# Patient Record
Sex: Male | Born: 1960
Health system: Southern US, Community
[De-identification: ages and names within clinical notes are randomized; demographics above are authoritative.]

## PROBLEM LIST (undated history)

## (undated) DIAGNOSIS — I10 Essential (primary) hypertension: Secondary | ICD-10-CM

## (undated) DIAGNOSIS — E785 Hyperlipidemia, unspecified: Secondary | ICD-10-CM

## (undated) HISTORY — DX: Essential (primary) hypertension: I10

## (undated) HISTORY — DX: Hyperlipidemia, unspecified: E78.5

---

## 2016-09-19 LAB — HM COLONOSCOPY

## 2021-03-22 ENCOUNTER — Other Ambulatory Visit: Payer: Self-pay

## 2021-03-22 ENCOUNTER — Ambulatory Visit: Payer: BC Managed Care – PPO | Admitting: Internal Medicine

## 2021-03-22 ENCOUNTER — Encounter: Payer: Self-pay | Admitting: Internal Medicine

## 2021-03-22 VITALS — BP 146/84 | HR 81 | Temp 98.0°F | Resp 18 | Ht 64.0 in | Wt 200.0 lb

## 2021-03-22 DIAGNOSIS — I10 Essential (primary) hypertension: Secondary | ICD-10-CM

## 2021-03-22 DIAGNOSIS — F17210 Nicotine dependence, cigarettes, uncomplicated: Secondary | ICD-10-CM | POA: Diagnosis not present

## 2021-03-22 DIAGNOSIS — Z114 Encounter for screening for human immunodeficiency virus [HIV]: Secondary | ICD-10-CM

## 2021-03-22 DIAGNOSIS — E782 Mixed hyperlipidemia: Secondary | ICD-10-CM | POA: Diagnosis not present

## 2021-03-22 DIAGNOSIS — Z720 Tobacco use: Secondary | ICD-10-CM | POA: Diagnosis not present

## 2021-03-22 DIAGNOSIS — Z1159 Encounter for screening for other viral diseases: Secondary | ICD-10-CM

## 2021-03-22 DIAGNOSIS — Z7689 Persons encountering health services in other specified circumstances: Secondary | ICD-10-CM | POA: Diagnosis not present

## 2021-03-22 MED ORDER — LISINOPRIL-HYDROCHLOROTHIAZIDE 20-25 MG PO TABS: 1.0000 | ORAL_TABLET | Freq: Every day | ORAL | 1 refills | Status: DC

## 2021-03-22 MED ORDER — SIMVASTATIN 20 MG PO TABS: 20.0000 mg | ORAL_TABLET | Freq: Every day | ORAL | 2 refills | Status: DC

## 2021-03-22 NOTE — Progress Notes (Signed)
New Patient Office Visit  Subjective:  Patient ID: Alexander Henson, male    DOB: [birth year]  Age: 60 y.o. MRN: [id number]  CC:  Chief Complaint  Patient presents with   New Patient (Initial Visit)    New patient was being seen [facility] [place] just establishing care     HPI Alexander Henson is a 60 y.o. male with PMH of HTN and HLD who presents for establishing care. He recently moved from [place].  HTN: His blood pressure was elevated in the office today.  He has run out of his medications since for last 1 month.  He mentions having mild, generalized headache for last few days.  He denies any dizziness, chest pain, dyspnea or palpitations.  HLD: He used to take simvastatin for it.  Denies any history of CAD or CVA.  He recently started smoking, but states that he smokes less than a pack per day.  He has had 1 dose of J&J COVID-vaccine.  Past Medical History:  Diagnosis Date   Hyperlipidemia    Hypertension     History reviewed. No pertinent surgical history.  History reviewed. No pertinent family history.  Social History   Socioeconomic History   Marital status: Single    Spouse name: Not on file   Number of children: Not on file   Years of education: Not on file   Highest education level: Not on file  Occupational History   Not on file  Tobacco Use   Smoking status: Some Days    Types: Cigarettes   Smokeless tobacco: Never  Substance and Sexual Activity   Alcohol use: Not Currently   Drug use: Never   Sexual activity: Not on file  Other Topics Concern   Not on file  Social History Narrative   Not on file   Social Determinants of Health   Financial Resource Strain: Not on file  Food Insecurity: Not on file  Transportation Needs: Not on file  Physical Activity: Not on file  Stress: Not on file  Social Connections: Not on file  Intimate Partner Violence: Not on file    ROS Review of Systems  Constitutional:  Negative for chills  and fever.  HENT:  Negative for congestion and sore throat.   Eyes:  Negative for pain and discharge.  Respiratory:  Negative for cough and shortness of breath.   Cardiovascular:  Negative for chest pain and palpitations.  Gastrointestinal:  Negative for constipation, diarrhea, nausea and vomiting.  Endocrine: Negative for polydipsia and polyuria.  Genitourinary:  Negative for dysuria and hematuria.  Musculoskeletal:  Negative for neck pain and neck stiffness.  Skin:  Negative for rash.  Neurological:  Positive for headaches. Negative for dizziness, weakness and numbness.  Psychiatric/Behavioral:  Negative for agitation and behavioral problems.    Objective:   Today's Vitals: BP (!) 146/84 (BP Location: Left Arm, Cuff Size: Normal)   Pulse 81   Temp 98 F (36.7 C) (Oral)   Resp 18   Ht '5\' 4"'  (1.626 m)   Wt 200 lb 0.6 oz (90.7 kg)   SpO2 92%   BMI 34.34 kg/m   Physical Exam Vitals reviewed.  Constitutional:      General: He is not in acute distress.    Appearance: He is not diaphoretic.  HENT:     Head: Normocephalic and atraumatic.     Nose: Nose normal.     Mouth/Throat:     Mouth: Mucous membranes are moist.  Eyes:     General: No scleral icterus.    Extraocular Movements: Extraocular movements intact.  Cardiovascular:     Rate and Rhythm: Normal rate and regular rhythm.     Pulses: Normal pulses.     Heart sounds: Normal heart sounds. No murmur heard. Pulmonary:     Breath sounds: Normal breath sounds. No wheezing or rales.  Abdominal:     Palpations: Abdomen is soft.     Tenderness: There is no abdominal tenderness.  Musculoskeletal:     Cervical back: Neck supple. No tenderness.     Right lower leg: No edema.     Left lower leg: No edema.  Skin:    General: Skin is warm.     Findings: No rash.  Neurological:     General: No focal deficit present.     Mental Status: He is alert and oriented to person, place, and time.  Psychiatric:        Mood and  Affect: Mood normal.        Behavior: Behavior normal.    Assessment & Plan:   Problem List Items Addressed This Visit       Encounter to establish care - Primary   Care established History and medications reviewed with the patient       Cardiovascular and Mediastinum   Essential hypertension    BP Readings from Last 1 Encounters:  03/22/21 (!) 146/84  Uncontrolled as he had run out of his medications Restarted Lisinopril-HCTZ 20-25 mg QD Counseled for compliance with the medications Advised DASH diet and moderate exercise/walking, at least 150 mins/week       Relevant Medications   lisinopril-hydrochlorothiazide (ZESTORETIC) 20-25 MG tablet   simvastatin (ZOCOR) 20 MG tablet   Other Relevant Orders   CBC with Differential/Platelet   CMP14+EGFR     Other         Relevant Orders   CBC with Differential/Platelet   Mixed hyperlipidemia    Restarted Simvastatin Check lipid profile      Relevant Medications   lisinopril-hydrochlorothiazide (ZESTORETIC) 20-25 MG tablet   simvastatin (ZOCOR) 20 MG tablet   Other Relevant Orders   Lipid Profile   Tobacco abuse    Smokes less than 1 pack/day, recently started  Asked about quitting: confirms that he currently smokes cigarettes Advise to quit smoking: Educated about QUITTING to reduce the risk of cancer, cardio and cerebrovascular disease. Assess willingness: Unwilling to quit at this time, but is working on cutting back. Assist with counseling and pharmacotherapy: Counseled for 5 minutes and literature provided. Arrange for follow up: Follow up in 3 months and continue to offer help.      Other Visit Diagnoses     Need for hepatitis C screening test       Relevant Orders   Hepatitis C Antibody   Encounter for screening for HIV       Relevant Orders   HIV antibody (with reflex)       Outpatient Encounter Medications as of 03/22/2021  Medication Sig   [DISCONTINUED] lisinopril-hydrochlorothiazide  (ZESTORETIC) 20-25 MG tablet Take 1 tablet by mouth daily.   [DISCONTINUED] simvastatin (ZOCOR) 20 MG tablet Take 20 mg by mouth at bedtime.   lisinopril-hydrochlorothiazide (ZESTORETIC) 20-25 MG tablet Take 1 tablet by mouth daily.   simvastatin (ZOCOR) 20 MG tablet Take 1 tablet (20 mg total) by mouth at bedtime.   No facility-administered encounter medications on file as of 03/22/2021.    Follow-up: Return in about 2  months (around 05/22/2021).   Lindell Spar, MD

## 2021-03-22 NOTE — Assessment & Plan Note (Signed)
BP Readings from Last 1 Encounters:  03/22/21 (!) 146/84   Uncontrolled as he had run out of his medications Restarted Lisinopril-HCTZ 20-25 mg QD Counseled for compliance with the medications Advised DASH diet and moderate exercise/walking, at least 150 mins/week

## 2021-03-22 NOTE — Assessment & Plan Note (Signed)
Restarted Simvastatin Check lipid profile

## 2021-03-22 NOTE — Patient Instructions (Addendum)
Please start taking medications as prescribed.  Please follow DASH diet and perform moderate exercise/walking at least 150 mins/week.  Thank you for choosing [facility]! We consider it our privilege to take care of you!

## 2021-03-22 NOTE — Assessment & Plan Note (Signed)
Care established History and medications reviewed with the patient 

## 2021-03-22 NOTE — Assessment & Plan Note (Addendum)
Smokes less than 1 pack/day, recently started ? ?Asked about quitting: confirms that he currently smokes cigarettes ?Advise to quit smoking: Educated about QUITTING to reduce the risk of cancer, cardio and cerebrovascular disease. ?Assess willingness: Unwilling to quit at this time, but is working on cutting back. ?Assist with counseling and pharmacotherapy: Counseled for 5 minutes and literature provided. ?Arrange for follow up: Follow up in 3 months and continue to offer help. ?

## 2021-04-22 ENCOUNTER — Other Ambulatory Visit: Payer: Self-pay | Admitting: Internal Medicine

## 2021-04-22 DIAGNOSIS — I10 Essential (primary) hypertension: Secondary | ICD-10-CM

## 2021-05-24 ENCOUNTER — Ambulatory Visit: Payer: BC Managed Care – PPO | Admitting: Internal Medicine

## 2021-05-24 ENCOUNTER — Other Ambulatory Visit: Payer: Self-pay

## 2021-05-24 ENCOUNTER — Encounter: Payer: Self-pay | Admitting: Internal Medicine

## 2021-05-24 VITALS — BP 124/78 | HR 70 | Resp 18 | Ht 64.0 in | Wt 199.1 lb

## 2021-05-24 DIAGNOSIS — I10 Essential (primary) hypertension: Secondary | ICD-10-CM

## 2021-05-24 DIAGNOSIS — E782 Mixed hyperlipidemia: Secondary | ICD-10-CM | POA: Diagnosis not present

## 2021-05-24 DIAGNOSIS — Z2821 Immunization not carried out because of patient refusal: Secondary | ICD-10-CM | POA: Diagnosis not present

## 2021-05-24 NOTE — Assessment & Plan Note (Signed)
BP Readings from Last 1 Encounters:  05/24/21 124/78   Well-controlled with Lisinopril-HCTZ 20-25 mg QD Counseled for compliance with the medications Advised DASH diet and moderate exercise/walking, at least 150 mins/week

## 2021-05-24 NOTE — Progress Notes (Signed)
Established Patient Office Visit  Subjective:  Patient ID: Alexander Henson, male    DOB: [birth year]  Age: 60 y.o. MRN: [id number]  CC:  Chief Complaint  Patient presents with   Follow-up    2 month follow up    HPI Alexander Henson is a 60 y.o. male with past medical history of HTN and HLD who presents for follow up of HTN.  HTN: BP is well-controlled. Takes medications regularly. Patient denies headache, dizziness, chest pain, dyspnea or palpitations.   Past Medical History:  Diagnosis Date   Hyperlipidemia    Hypertension     History reviewed. No pertinent surgical history.  History reviewed. No pertinent family history.  Social History   Socioeconomic History   Marital status: Single    Spouse name: Not on file   Number of children: Not on file   Years of education: Not on file   Highest education level: Not on file  Occupational History   Not on file  Tobacco Use   Smoking status: Some Days    Types: Cigarettes   Smokeless tobacco: Never  Substance and Sexual Activity   Alcohol use: Not Currently   Drug use: Never   Sexual activity: Not on file  Other Topics Concern   Not on file  Social History Narrative   Not on file   Social Determinants of Health   Financial Resource Strain: Not on file  Food Insecurity: Not on file  Transportation Needs: Not on file  Physical Activity: Not on file  Stress: Not on file  Social Connections: Not on file  Intimate Partner Violence: Not on file    Outpatient Medications Prior to Visit  Medication Sig Dispense Refill   lisinopril-hydrochlorothiazide (ZESTORETIC) 20-25 MG tablet TAKE 1 TABLET BY MOUTH DAILY 30 tablet 5   simvastatin (ZOCOR) 20 MG tablet Take 1 tablet (20 mg total) by mouth at bedtime. 30 tablet 2   No facility-administered medications prior to visit.    Not on File  ROS Review of Systems  Constitutional:  Negative for chills and fever.  HENT:  Negative for congestion and sore throat.   Eyes:   Negative for pain and discharge.  Respiratory:  Negative for cough and shortness of breath.   Cardiovascular:  Negative for chest pain and palpitations.  Gastrointestinal:  Negative for constipation, diarrhea, nausea and vomiting.  Endocrine: Negative for polydipsia and polyuria.  Genitourinary:  Negative for dysuria and hematuria.  Musculoskeletal:  Negative for neck pain and neck stiffness.  Skin:  Negative for rash.  Neurological:  Negative for dizziness, weakness and numbness.  Psychiatric/Behavioral:  Negative for agitation and behavioral problems.      Objective:    Physical Exam Vitals reviewed.  Constitutional:      General: He is not in acute distress.    Appearance: He is not diaphoretic.  HENT:     Head: Normocephalic and atraumatic.     Nose: Nose normal.     Mouth/Throat:     Mouth: Mucous membranes are moist.  Eyes:     General: No scleral icterus.    Extraocular Movements: Extraocular movements intact.  Cardiovascular:     Rate and Rhythm: Normal rate and regular rhythm.     Pulses: Normal pulses.     Heart sounds: Normal heart sounds. No murmur heard. Pulmonary:     Breath sounds: Normal breath sounds. No wheezing or rales.  Abdominal:     Palpations: Abdomen is soft.     Tenderness:  There is no abdominal tenderness.  Musculoskeletal:     Cervical back: Neck supple. No tenderness.     Right lower leg: No edema.     Left lower leg: No edema.  Skin:    General: Skin is warm.     Findings: No rash.  Neurological:     General: No focal deficit present.     Mental Status: He is alert and oriented to person, place, and time.  Psychiatric:        Mood and Affect: Mood normal.        Behavior: Behavior normal.    BP 124/78 (BP Location: Left Arm, Patient Position: Sitting, Cuff Size: Normal)   Pulse 70   Resp 18   Ht _0  (1.626 m)   Wt 199 lb 1.3 oz (90.3 kg)   SpO2 96%   BMI 34.17 kg/m  Wt Readings from Last 3 Encounters:  05/24/21 199 lb 1.3 oz  (90.3 kg)  03/22/21 200 lb 0.6 oz (90.7 kg)    No results found for: TSH No results found for: WBC, HGB, HCT, MCV, PLT No results found for: NA, K, CHLORIDE, CO2, GLUCOSE, BUN, CREATININE, BILITOT, ALKPHOS, AST, ALT, PROT, ALBUMIN, CALCIUM, ANIONGAP, EGFR, GFR No results found for: CHOL No results found for: HDL No results found for: LDLCALC No results found for: TRIG No results found for: CHOLHDL No results found for: HGBA1C    Assessment & Plan:   Problem List Items Addressed This Visit       Cardiovascular and Mediastinum   Essential hypertension - Primary    BP Readings from Last 1 Encounters:  05/24/21 124/78  Well-controlled with Lisinopril-HCTZ 20-25 mg QD Counseled for compliance with the medications Advised DASH diet and moderate exercise/walking, at least 150 mins/week         Other   Mixed hyperlipidemia    Restarted Simvastatin Check lipid profile      Other Visit Diagnoses     Refused influenza vaccine           No orders of the defined types were placed in this encounter.   Follow-up: Return in about 6 months (around 11/21/2021) for Annual physical.    Alexander Spar, MD

## 2021-05-24 NOTE — Patient Instructions (Signed)
Please continue taking medications as prescribed.  Please continue to follow DASH diet and perform moderate exercise/walking at least 150 mins/week.  Please get fasting blood tests done within a week. 

## 2021-05-24 NOTE — Assessment & Plan Note (Signed)
Restarted Simvastatin Check lipid profile

## 2021-05-29 ENCOUNTER — Encounter: Payer: Self-pay | Admitting: Nurse Practitioner

## 2021-05-29 ENCOUNTER — Other Ambulatory Visit: Payer: Self-pay

## 2021-05-29 ENCOUNTER — Ambulatory Visit: Payer: BC Managed Care – PPO | Admitting: Nurse Practitioner

## 2021-05-29 VITALS — BP 138/78 | HR 78 | Temp 97.9°F | Ht 64.0 in | Wt 202.0 lb

## 2021-05-29 DIAGNOSIS — I10 Essential (primary) hypertension: Secondary | ICD-10-CM | POA: Diagnosis not present

## 2021-05-29 DIAGNOSIS — R109 Unspecified abdominal pain: Secondary | ICD-10-CM

## 2021-05-29 LAB — POCT URINALYSIS DIP (CLINITEK)
Bilirubin, UA: NEGATIVE
Blood, UA: NEGATIVE
Glucose, UA: NEGATIVE mg/dL
Ketones, POC UA: NEGATIVE mg/dL
Leukocytes, UA: NEGATIVE
Nitrite, UA: NEGATIVE
POC PROTEIN,UA: NEGATIVE
Spec Grav, UA: 1.015 (ref 1.010–1.025)
Urobilinogen, UA: 0.2 E.U./dL
pH, UA: 5.5 (ref 5.0–8.0)

## 2021-05-29 MED ORDER — CYCLOBENZAPRINE HCL 5 MG PO TABS: 5.0000 mg | ORAL_TABLET | Freq: Three times a day (TID) | ORAL | 1 refills | Status: DC | PRN

## 2021-05-29 MED ORDER — IBUPROFEN 600 MG PO TABS: 600.0000 mg | ORAL_TABLET | Freq: Three times a day (TID) | ORAL | 0 refills | Status: DC | PRN

## 2021-05-29 NOTE — Progress Notes (Deleted)
   Alexander Henson     MRN: [id number]      DOB: [birth year]   HPI Alexander Henson is here for acute visit.  PT c/o right flank pain that started suddenly  pain is getting worse, aching pain 7/10. Moving and walking make it worse.  Pin feels like the type of pain he had 8 years ago when he problems with kidney. PT is a Corporate investment banker. No fever , chills , numbness , tingling , no bloody urine, no constipation, no diarrhea, no blood in the stool.  PT took some ibuprofen this morning and it did not help.      ROS Denies recent fever or chills. Denies sinus pressure, nasal congestion, ear pain or sore throat. Denies chest congestion, productive cough or wheezing. Denies chest pains, palpitations and leg swelling Denies abdominal pain, nausea, vomiting,diarrhea or constipation.   Denies dysuria, frequency, hesitancy or incontinence. Denies joint pain, swelling and limitation in mobility. Denies headaches, seizures, numbness, or tingling. Denies depression, anxiety or insomnia. Denies skin break down or rash.   PE  BP (!) 154/79 (BP Location: Left Arm, Patient Position: Sitting, Cuff Size: Normal)   Pulse 78   Temp 97.9 F (36.6 C) (Oral)   Ht 5\' 4"  (1.626 m)   Wt 202 lb (91.6 kg)   SpO2 98%   BMI 34.67 kg/m   Patient alert and oriented and in no cardiopulmonary distress.  HEENT: No facial asymmetry, EOMI,     Neck supple .  Chest: Clear to auscultation bilaterally.  CVS: S1, S2 no murmurs, no S3.Regular rate.  ABD: Soft non tender.   Ext: No edema  MS: Adequate ROM spine, shoulders, hips and knees.  Skin: Intact, no ulcerations or rash noted.  Psych: Good eye contact, normal affect. Memory intact not anxious or depressed appearing.  CNS: CN 2-12 intact, power,  normal throughout.no focal deficits noted.   Assessment & Plan  ***

## 2021-05-29 NOTE — Patient Instructions (Signed)
Please get your labs done tomorrow as we discussed. Take ibuprofen 600mg  three times daily as needed for your left side pain.  Take flexeril 5mg  three times daily as needed for muscle spasm.  Please call the office by Wednesday if your pain does not get better  if it gets worse.    It is important that you exercise regularly at least 30 minutes 5 times a week.  Think about what you will eat, plan ahead. Choose " clean, green, fresh or frozen" over canned, processed or packaged foods which are more sugary, salty and fatty. 70 to 75% of food eaten should be vegetables and fruit. Three meals at set times with snacks allowed between meals, but they must be fruit or vegetables. Aim to eat over a 12 hour period , example 7 am to 7 pm, and STOP after  your last meal of the day. Drink water,generally about 64 ounces per day, no other drink is as healthy. Fruit juice is best enjoyed in a healthy way, by EATING the fruit.  Thanks for choosing [facility], we consider it a privelige to serve you.

## 2021-05-29 NOTE — Assessment & Plan Note (Addendum)
Well controlled, continue current med.  Importance of low salt diet and regular vigorous exercise 30 minutes 5 times a week discussed.  PT advised to get labs done as ordered.

## 2021-05-29 NOTE — Progress Notes (Signed)
Acute Office Visit  Subjective:    Patient ID: Alexander Henson, male    DOB: December 18, 1960, 60 y.o.   MRN: [id number]  Chief Complaint  Patient presents with   Back Pain    Started having right lower back pain this morning. Has used heating pad which did help some. Hurts more when up and walking. No dysuria or trouble with bowels.     Back Pain Pertinent negatives include no abdominal pain, chest pain, dysuria or fever.  Patient is in today for r. [name] is here for acute visit.  PT c/o right flank pain that started suddenly  pain is getting worse, aching pain 7/10. Moving and walking make it worse.  Pain feels like the type of pain he had 8 years ago when he problems with kidney. PT is a Nature conservation officer. PT denies fever ,chills CP palpitation wheezing,, SOB, numbness , tingling , bloody urine, dysuria, cloudy, constipation,  diarrhea, blood in the stool.  PT took some ibuprofen this morning and it did not help.   Past Medical History:  Diagnosis Date   Hyperlipidemia    Hypertension     History reviewed. No pertinent surgical history.  History reviewed. No pertinent family history.  Social History   Socioeconomic History   Marital status: Single    Spouse name: Not on file   Number of children: Not on file   Years of education: Not on file   Highest education level: Not on file  Occupational History   Not on file  Tobacco Use   Smoking status: Some Days    Types: Cigarettes   Smokeless tobacco: Never  Substance and Sexual Activity   Alcohol use: Not Currently   Drug use: Never   Sexual activity: Not on file  Other Topics Concern   Not on file  Social History Narrative   Not on file   Social Determinants of Health   Financial Resource Strain: Not on file  Food Insecurity: Not on file  Transportation Needs: Not on file  Physical Activity: Not on file  Stress: Not on file  Social Connections: Not on file  Intimate Partner Violence: Not on file     Outpatient Medications Prior to Visit  Medication Sig Dispense Refill   lisinopril-hydrochlorothiazide (ZESTORETIC) 20-25 MG tablet TAKE 1 TABLET BY MOUTH DAILY 30 tablet 5   simvastatin (ZOCOR) 20 MG tablet Take 1 tablet (20 mg total) by mouth at bedtime. 30 tablet 2   No facility-administered medications prior to visit.    No Known Allergies  Review of Systems  Constitutional:  Negative for activity change, chills, fatigue and fever.  Respiratory:  Negative for cough, chest tightness, shortness of breath and wheezing.   Cardiovascular:  Negative for chest pain, palpitations and leg swelling.  Gastrointestinal:  Negative for abdominal pain, anal bleeding, blood in stool, constipation, diarrhea, nausea and vomiting.       Right flank pain 7/10  Genitourinary:  Positive for flank pain. Negative for decreased urine volume, dysuria, frequency, hematuria, penile discharge and urgency.  Psychiatric/Behavioral:  Negative for agitation, behavioral problems and confusion. The patient is not nervous/anxious.       Objective:    Physical Exam Constitutional:      General: He is not in acute distress.    Appearance: He is obese. He is not ill-appearing, toxic-appearing or diaphoretic.  Cardiovascular:     Rate and Rhythm: Normal rate and regular rhythm.     Pulses: Normal pulses.  Heart sounds: Normal heart sounds. No murmur heard.   No friction rub. No gallop.  Pulmonary:     Effort: Pulmonary effort is normal. No respiratory distress.     Breath sounds: Normal breath sounds. No stridor. No wheezing, rhonchi or rales.  Chest:     Chest wall: No tenderness.  Abdominal:     Palpations: Abdomen is soft. There is no mass.     Tenderness: There is right CVA tenderness. There is no guarding or rebound.     Hernia: No hernia is present.  Neurological:     Mental Status: He is alert.  Psychiatric:        Mood and Affect: Mood normal.        Behavior: Behavior normal.         Thought Content: Thought content normal.        Judgment: Judgment normal.    BP (!) 154/79 (BP Location: Left Arm, Patient Position: Sitting, Cuff Size: Normal)   Pulse 78   Temp 97.9 F (36.6 C) (Oral)   Ht '5\' 4"'  (1.626 m)   Wt 202 lb (91.6 kg)   SpO2 98%   BMI 34.67 kg/m  Wt Readings from Last 3 Encounters:  05/29/21 202 lb (91.6 kg)  05/24/21 199 lb 1.3 oz (90.3 kg)  03/22/21 200 lb 0.6 oz (90.7 kg)    Health Maintenance Due  Topic Date Due   Pneumococcal Vaccine 69-70 Years old (1 - PCV) Never done   Hepatitis C Screening  Never done   TETANUS/TDAP  Never done   COLONOSCOPY (Pts 45-56yr Insurance coverage will need to be confirmed)  Never done   Zoster Vaccines- Shingrix (1 of 2) Never done   COVID-19 Vaccine (2 - Booster for Janssen series) 07/19/2020    There are no preventive care reminders to display for this patient.   No results found for: TSH No results found for: WBC, HGB, HCT, MCV, PLT No results found for: NA, K, CHLORIDE, CO2, GLUCOSE, BUN, CREATININE, BILITOT, ALKPHOS, AST, ALT, PROT, ALBUMIN, CALCIUM, ANIONGAP, EGFR, GFR No results found for: CHOL No results found for: HDL No results found for: LDLCALC No results found for: TRIG No results found for: CHOLHDL No results found for: HGBA1C     Assessment & Plan:   Problem List Items Addressed This Visit   None    No orders of the defined types were placed in this encounter.    FRenee Rival FNP

## 2021-05-29 NOTE — Assessment & Plan Note (Addendum)
Urinalysis negative, negative for blood. Ibuprofen 600mg  TID for pain Flexeril 5mg  TID for muscle spasm. PT told that flexeril can cause drowsiness and that he should not operate any machinery while taking flexeril.   Do abdominal if pain does not get better in 2 days.  PT told to drink plenty of fluids and watch for Kidney stones in his urine

## 2021-05-30 ENCOUNTER — Telehealth: Payer: Self-pay | Admitting: Internal Medicine

## 2021-05-30 DIAGNOSIS — E782 Mixed hyperlipidemia: Secondary | ICD-10-CM | POA: Diagnosis not present

## 2021-05-30 DIAGNOSIS — Z7689 Persons encountering health services in other specified circumstances: Secondary | ICD-10-CM | POA: Diagnosis not present

## 2021-05-30 DIAGNOSIS — Z114 Encounter for screening for human immunodeficiency virus [HIV]: Secondary | ICD-10-CM | POA: Diagnosis not present

## 2021-05-30 DIAGNOSIS — I10 Essential (primary) hypertension: Secondary | ICD-10-CM | POA: Diagnosis not present

## 2021-05-30 DIAGNOSIS — Z1159 Encounter for screening for other viral diseases: Secondary | ICD-10-CM | POA: Diagnosis not present

## 2021-05-30 NOTE — Telephone Encounter (Signed)
Called and spoke with pharmacist at [facility]. Medication needs Prior Authorization. I have started PA through Covermy meds. Will notified patient once decision has been made on coverage. Thanks

## 2021-05-30 NOTE — Telephone Encounter (Signed)
Pt is stating that he could not get the flexirill as the insurance needed to know why   I advised pt I would send to the nurse to look into .   Please call the pt [phone]

## 2021-05-31 ENCOUNTER — Other Ambulatory Visit: Payer: Self-pay

## 2021-05-31 ENCOUNTER — Encounter: Payer: Self-pay | Admitting: Nurse Practitioner

## 2021-05-31 ENCOUNTER — Telehealth: Payer: Self-pay

## 2021-05-31 ENCOUNTER — Ambulatory Visit: Payer: BC Managed Care – PPO | Admitting: Nurse Practitioner

## 2021-05-31 VITALS — BP 136/74 | HR 77 | Temp 98.2°F | Ht 64.0 in | Wt 202.0 lb

## 2021-05-31 DIAGNOSIS — R109 Unspecified abdominal pain: Secondary | ICD-10-CM

## 2021-05-31 DIAGNOSIS — E782 Mixed hyperlipidemia: Secondary | ICD-10-CM

## 2021-05-31 LAB — CMP14+EGFR
ALT: 33 IU/L (ref 0–44)
AST: 23 IU/L (ref 0–40)
Albumin/Globulin Ratio: 2.5 — ABNORMAL HIGH (ref 1.2–2.2)
Albumin: 4.8 g/dL (ref 3.8–4.9)
Alkaline Phosphatase: 82 IU/L (ref 44–121)
BUN/Creatinine Ratio: 17 (ref 9–20)
BUN: 17 mg/dL (ref 6–24)
Bilirubin Total: 0.5 mg/dL (ref 0.0–1.2)
CO2: 25 mmol/L (ref 20–29)
Calcium: 9 mg/dL (ref 8.7–10.2)
Chloride: 104 mmol/L (ref 96–106)
Creatinine, Ser: 1.03 mg/dL (ref 0.76–1.27)
Globulin, Total: 1.9 g/dL (ref 1.5–4.5)
Glucose: 100 mg/dL — ABNORMAL HIGH (ref 70–99)
Potassium: 4.4 mmol/L (ref 3.5–5.2)
Sodium: 142 mmol/L (ref 134–144)
Total Protein: 6.7 g/dL (ref 6.0–8.5)
eGFR: 84 mL/min/{1.73_m2} (ref >59)

## 2021-05-31 LAB — LIPID PANEL
Chol/HDL Ratio: 5.1 ratio — ABNORMAL HIGH (ref 0.0–5.0)
Cholesterol, Total: 190 mg/dL (ref 100–199)
HDL: 37 mg/dL — ABNORMAL LOW (ref >39)
LDL Chol Calc (NIH): 116 mg/dL — ABNORMAL HIGH (ref 0–99)
Triglycerides: 213 mg/dL — ABNORMAL HIGH (ref 0–149)
VLDL Cholesterol Cal: 37 mg/dL (ref 5–40)

## 2021-05-31 LAB — CBC WITH DIFFERENTIAL/PLATELET
Basophils Absolute: 0.1 10*3/uL (ref 0.0–0.2)
Basos: 1 %
EOS (ABSOLUTE): 0.4 10*3/uL (ref 0.0–0.4)
Eos: 6 %
Hematocrit: 48.8 % (ref 37.5–51.0)
Hemoglobin: 16.8 g/dL (ref 13.0–17.7)
Immature Grans (Abs): 0 10*3/uL (ref 0.0–0.1)
Immature Granulocytes: 0 %
Lymphocytes Absolute: 2.2 10*3/uL (ref 0.7–3.1)
Lymphs: 33 %
MCH: 32.1 pg (ref 26.6–33.0)
MCHC: 34.4 g/dL (ref 31.5–35.7)
MCV: 93 fL (ref 79–97)
Monocytes Absolute: 0.6 10*3/uL (ref 0.1–0.9)
Monocytes: 9 %
Neutrophils Absolute: 3.4 10*3/uL (ref 1.4–7.0)
Neutrophils: 51 %
Platelets: 277 10*3/uL (ref 150–450)
RBC: 5.24 x10E6/uL (ref 4.14–5.80)
RDW: 11.9 % (ref 11.6–15.4)
WBC: 6.6 10*3/uL (ref 3.4–10.8)

## 2021-05-31 LAB — HIV ANTIBODY (ROUTINE TESTING W REFLEX): HIV Screen 4th Generation wRfx: NONREACTIVE

## 2021-05-31 LAB — HEPATITIS C ANTIBODY: Hep C Virus Ab: <0.1 s/co ratio (ref 0.0–0.9)

## 2021-05-31 MED ORDER — PREDNISONE 10 MG PO TABS: 10.0000 mg | ORAL_TABLET | Freq: Two times a day (BID) | ORAL | 0 refills | Status: DC

## 2021-05-31 MED ORDER — PREDNISONE 10 MG PO TABS: 10.0000 mg | ORAL_TABLET | Freq: Two times a day (BID) | ORAL | 0 refills | Status: AC

## 2021-05-31 MED ORDER — PANTOPRAZOLE SODIUM 40 MG PO TBEC
40.0000 mg | DELAYED_RELEASE_TABLET | Freq: Every day | ORAL | 0 refills | Status: DC
Start: 2021-05-31 — End: 2021-11-21

## 2021-05-31 NOTE — Telephone Encounter (Signed)
Called and spoke with pharmacy this morning, it looks like there is a quantity limit on patients medication. Pharmacy ran medicine for quantity of 15 which still required PA through Cover my Meds. She was going to resubmit submission through Cover my meds to push getting patients medication approved.  Patient is also scheduled to come in today to see Mitzi Davenport again since his pain has not improved. Will re-evaluate at time of appointment

## 2021-05-31 NOTE — Telephone Encounter (Signed)
ERROR

## 2021-05-31 NOTE — Assessment & Plan Note (Signed)
Pt still having pains. Get Renal US  Use ibuprofen 2 times daily PRN Flexeril 5mg  PRN Prednisone 10mg  two times daily for 5 days.  Pt educated told to take protonix 40 mg daily while taking prednisone to help prevent GI upset.

## 2021-05-31 NOTE — Progress Notes (Signed)
   Alexander Henson     MRN: [id number]      DOB: Jan 08, 1961   HPI Alexander Henson is here for acute visit. He was seen three day ago with c/o right side pain. Today pt is still having the pain , he described it as constant  aching pain 5/10. Patient is nit worse but not getting better. He has been using ibuprofen twice daily, Heating pad helps his pain, he has not been able to fill his muscle relaxant due to insurance not approving it.He denies blood in the urine.  ROS Denies recent fever or chills. Denies sinus pressure, nasal congestion, ear pain or sore throat. Denies chest congestion, productive cough or wheezing. Denies chest pains, palpitations and leg swelling Has right side pain, no nausea, vomiting,diarrhea or constipation.   Denies dysuria, frequency, hesitancy or incontinence. Denies joint pain, swelling and limitation in mobility. Denies headaches, seizures, numbness, or tingling. Denies depression, anxiety or insomnia. Denies skin break down or rash.   PE  BP (!) 147/80 (BP Location: Right Arm, Patient Position: Sitting, Cuff Size: Large)   Pulse 77   Temp 98.2 F (36.8 C) (Oral)   Ht 5\' 4"  (1.626 m)   Wt 202 lb (91.6 kg)   SpO2 98%   BMI 34.67 kg/m   Patient alert and oriented and in no cardiopulmonary distress.  HEENT: No facial asymmetry, EOMI,     Neck supple .  Chest: Clear to auscultation bilaterally.  CVS: S1, S2 no murmurs, no S3.Regular rate.  ABD: Soft non tender. ,   Ext: No edema  MS: right rib cage tenderness on palpation. Adequate ROM spine, shoulders, hips and knees.  Skin: Intact, no ulcerations or rash noted.  Psych: Good eye contact, normal affect. Memory intact not anxious or depressed appearing.  CNS: CN 2-12 intact, power,  normal throughout.no focal deficits noted.   Assessment & Plan

## 2021-05-31 NOTE — Patient Instructions (Signed)
Please get the ultrasound of your kidneys done.  Take prednisone 10mg  twice daily for 5 days.   Eat a healthy diet, including lots of fruits and vegetables. Avoid foods with a lot of saturated and trans fats, such as red meat, butter, fried foods and cheese . Maintain a healthy weight.

## 2021-05-31 NOTE — Assessment & Plan Note (Signed)
Eat a healthy diet, including lots of fruits and vegetables. Avoid foods with a lot of saturated and trans fats, such as red meat, butter, fried foods and cheese . Regular vigorous exercise 30 minutes 5 times a week.  Continue simvastatin 20mg 

## 2021-06-02 ENCOUNTER — Ambulatory Visit (HOSPITAL_COMMUNITY): Payer: BC Managed Care – PPO

## 2021-06-06 ENCOUNTER — Ambulatory Visit (HOSPITAL_COMMUNITY)
Admission: RE | Admit: 2021-06-06 | Discharge: 2021-06-06 | Disposition: A | Discharge status: Home / Self Care | Payer: BC Managed Care – PPO | Source: Ambulatory Visit | Attending: Nurse Practitioner | Admitting: Nurse Practitioner

## 2021-06-06 ENCOUNTER — Other Ambulatory Visit: Payer: Self-pay

## 2021-06-06 DIAGNOSIS — R109 Unspecified abdominal pain: Secondary | ICD-10-CM | POA: Insufficient documentation

## 2021-06-06 DIAGNOSIS — N281 Cyst of kidney, acquired: Secondary | ICD-10-CM | POA: Diagnosis not present

## 2021-06-07 ENCOUNTER — Other Ambulatory Visit: Payer: Self-pay | Admitting: Nurse Practitioner

## 2021-06-07 DIAGNOSIS — R109 Unspecified abdominal pain: Secondary | ICD-10-CM

## 2021-06-12 ENCOUNTER — Encounter: Payer: Self-pay | Admitting: *Deleted

## 2021-06-17 ENCOUNTER — Other Ambulatory Visit: Payer: Self-pay | Admitting: Internal Medicine

## 2021-06-17 DIAGNOSIS — E782 Mixed hyperlipidemia: Secondary | ICD-10-CM

## 2021-09-08 ENCOUNTER — Other Ambulatory Visit: Payer: Self-pay

## 2021-09-08 ENCOUNTER — Ambulatory Visit: Payer: BC Managed Care – PPO | Admitting: Nurse Practitioner

## 2021-09-08 ENCOUNTER — Encounter: Payer: Self-pay | Admitting: Nurse Practitioner

## 2021-09-08 DIAGNOSIS — J019 Acute sinusitis, unspecified: Secondary | ICD-10-CM | POA: Diagnosis not present

## 2021-09-08 MED ORDER — FLUTICASONE PROPIONATE 50 MCG/ACT NA SUSP: 2.0000 | Freq: Every day | NASAL | 6 refills | Status: AC

## 2021-09-08 NOTE — Progress Notes (Signed)
Virtual Visit via Telephone Note ? ?I connected with Alexander Henson date@ on 09/08/21 at 1:58pmby telephone and verified that I am speaking with the correct person using two identifiers.  I spent 6 minutes on this telephone encounter ? ?Location: ?Patient: home ?Provider: office ?  ?I discussed the limitations, risks, security and privacy concerns of performing an evaluation and management service by telephone and the availability of in person appointments. I also discussed with the patient that there may be a patient responsible charge related to this service. The patient expressed understanding and agreed to proceed. ? ? ?History of Present Illness: ?Pt c/o head congestion, ''little cough with some sputum '' sinus pressure, running nose with clear color drainage ,stuffy nose,  symptoms started about 2 days ago. Pt denies fever, chills, cp , bloody sputum. Took tylenol sinus but its not helping. He has purchased allergy pills but he has not started using it.  Patient states that he usually has symptoms around this time of the year ?  ?Observations/Objective: ? ? ?Assessment and Plan: ?Acute rhino sinusitis ?Start Flonase nasal spray, use 2 spray into the nose daily ?Patient told to use OTC Allegra as instructed ?Use  Tylenol 650 mg every 6 hours as needed for headache ? ?Follow Up Instructions: ? ?  ?I discussed the assessment and treatment plan with the patient. The patient was provided an opportunity to ask questions and all were answered. The patient agreed with the plan and demonstrated an understanding of the instructions. ?  ?The patient was advised to call back or seek an in-person evaluation if the symptoms worsen or if the condition fails to improve as anticipated.  ?

## 2021-09-08 NOTE — Assessment & Plan Note (Addendum)
Acute rhino sinusitis ?Start Flonase nasal spray, use 2 spray into the nose daily ?Patient told to use OTC Allegra as instructed ?Use  Tylenol 650 mg every 6 hours as needed for headache ? ?

## 2021-09-11 DIAGNOSIS — R051 Acute cough: Secondary | ICD-10-CM | POA: Diagnosis not present

## 2021-10-20 ENCOUNTER — Other Ambulatory Visit: Payer: Self-pay | Admitting: Internal Medicine

## 2021-10-20 DIAGNOSIS — E782 Mixed hyperlipidemia: Secondary | ICD-10-CM

## 2021-10-20 DIAGNOSIS — I10 Essential (primary) hypertension: Secondary | ICD-10-CM

## 2021-11-21 ENCOUNTER — Encounter: Payer: Self-pay | Admitting: Internal Medicine

## 2021-11-21 ENCOUNTER — Ambulatory Visit (INDEPENDENT_AMBULATORY_CARE_PROVIDER_SITE_OTHER): Payer: BC Managed Care – PPO | Admitting: Internal Medicine

## 2021-11-21 VITALS — BP 136/84 | HR 73 | Resp 18 | Ht 64.0 in | Wt 196.8 lb

## 2021-11-21 DIAGNOSIS — Z720 Tobacco use: Secondary | ICD-10-CM

## 2021-11-21 DIAGNOSIS — E782 Mixed hyperlipidemia: Secondary | ICD-10-CM | POA: Diagnosis not present

## 2021-11-21 DIAGNOSIS — Z125 Encounter for screening for malignant neoplasm of prostate: Secondary | ICD-10-CM

## 2021-11-21 DIAGNOSIS — Z0001 Encounter for general adult medical examination with abnormal findings: Secondary | ICD-10-CM | POA: Diagnosis not present

## 2021-11-21 DIAGNOSIS — Z23 Encounter for immunization: Secondary | ICD-10-CM | POA: Diagnosis not present

## 2021-11-21 DIAGNOSIS — K219 Gastro-esophageal reflux disease without esophagitis: Secondary | ICD-10-CM | POA: Diagnosis not present

## 2021-11-21 DIAGNOSIS — I10 Essential (primary) hypertension: Secondary | ICD-10-CM

## 2021-11-21 DIAGNOSIS — E559 Vitamin D deficiency, unspecified: Secondary | ICD-10-CM | POA: Diagnosis not present

## 2021-11-21 MED ORDER — OMEPRAZOLE 20 MG PO CPDR: 20.0000 mg | DELAYED_RELEASE_CAPSULE | Freq: Every day | ORAL | 5 refills | Status: DC

## 2021-11-21 NOTE — Assessment & Plan Note (Signed)
Restarted Simvastatin Check lipid profile 

## 2021-11-21 NOTE — Assessment & Plan Note (Signed)
Ordered PSA after discussing its limitations for prostate cancer screening, including false positive results leading additional investigations. 

## 2021-11-21 NOTE — Progress Notes (Signed)
? ?Established Patient Office Visit ? ?Subjective:  ?Patient ID: Alexander Henson, male    DOB: 08/06/1960  Age: 61 y.o. MRN: [id number] ? ?CC:  ?Chief Complaint  ?Patient presents with  ? Annual Exam  ?  Annual exam patient noticed both feet are swelling worse at night for the past two weeks   ? ? ?HPI ?Alexander Henson is a 61 y.o. male with past medical history of HTN and HLD who presents for annual physical. ? ?HTN: BP is well-controlled. Takes medications regularly. Patient denies headache, dizziness, chest pain, dyspnea or palpitations. ? ?He c/o leg swelling after standing for 10 hours on a concrete floor at work. Denies any orthopnea or PND. His swelling improves after leg elevation at nighttime. ? ?He received first dose of Shingrix vaccine today. ? ? ? ?Past Medical History:  ?Diagnosis Date  ? Hyperlipidemia   ? Hypertension   ? ? ?History reviewed. No pertinent surgical history. ? ?History reviewed. No pertinent family history. ? ?Social History  ? ?Socioeconomic History  ? Marital status: Single  ?  Spouse name: Not on file  ? Number of children: Not on file  ? Years of education: Not on file  ? Highest education level: Not on file  ?Occupational History  ? Not on file  ?Tobacco Use  ? Smoking status: Some Days  ?  Types: Cigarettes  ? Smokeless tobacco: Never  ?Substance and Sexual Activity  ? Alcohol use: Not Currently  ? Drug use: Never  ? Sexual activity: Not on file  ?Other Topics Concern  ? Not on file  ?Social History Narrative  ? Not on file  ? ?Social Determinants of Health  ? ?Financial Resource Strain: Not on file  ?Food Insecurity: Not on file  ?Transportation Needs: Not on file  ?Physical Activity: Not on file  ?Stress: Not on file  ?Social Connections: Not on file  ?Intimate Partner Violence: Not on file  ? ? ?Outpatient Medications Prior to Visit  ?Medication Sig Dispense Refill  ? fluticasone (FLONASE) 50 MCG/ACT nasal spray Place 2 sprays into both nostrils daily. 16 g 6  ?  lisinopril-hydrochlorothiazide (ZESTORETIC) 20-25 MG tablet TAKE 1 TABLET BY MOUTH DAILY 30 tablet 5  ? simvastatin (ZOCOR) 20 MG tablet TAKE 1 TABLET(20 MG) BY MOUTH AT BEDTIME 30 tablet 2  ? ibuprofen (ADVIL) 600 MG tablet Take 1 tablet (600 mg total) by mouth every 8 (eight) hours as needed. 30 tablet 0  ? pantoprazole (PROTONIX) 40 MG tablet Take 1 tablet (40 mg total) by mouth daily. 10 tablet 0  ? cyclobenzaprine (FLEXERIL) 5 MG tablet Take 1 tablet (5 mg total) by mouth 3 (three) times daily as needed for muscle spasms. (Patient not taking: Reported on 11/21/2021) 20 tablet 1  ? ?No facility-administered medications prior to visit.  ? ? ?No Known Allergies ? ?ROS ?Review of Systems  ?Constitutional:  Negative for chills and fever.  ?HENT:  Negative for congestion and sore throat.   ?Eyes:  Negative for pain and discharge.  ?Respiratory:  Negative for cough and shortness of breath.   ?Cardiovascular:  Negative for chest pain and palpitations.  ?Gastrointestinal:  Negative for constipation, diarrhea, nausea and vomiting.  ?Endocrine: Negative for polydipsia and polyuria.  ?Genitourinary:  Negative for dysuria and hematuria.  ?Musculoskeletal:  Negative for neck pain and neck stiffness.  ?Skin:  Negative for rash.  ?Neurological:  Negative for dizziness, weakness and numbness.  ?Psychiatric/Behavioral:  Negative for agitation and behavioral problems.   ? ?  ?  Objective:  ?  ?Physical Exam ?Vitals reviewed.  ?Constitutional:   ?   General: He is not in acute distress. ?   Appearance: He is obese. He is not diaphoretic.  ?HENT:  ?   Head: Normocephalic and atraumatic.  ?   Nose: Nose normal.  ?   Mouth/Throat:  ?   Mouth: Mucous membranes are moist.  ?Eyes:  ?   General: No scleral icterus. ?   Extraocular Movements: Extraocular movements intact.  ?Neck:  ?   Vascular: No carotid bruit.  ?Cardiovascular:  ?   Rate and Rhythm: Normal rate and regular rhythm.  ?   Pulses: Normal pulses.  ?   Heart sounds: Normal heart  sounds. No murmur heard. ?Pulmonary:  ?   Breath sounds: Normal breath sounds. No wheezing or rales.  ?Abdominal:  ?   Palpations: Abdomen is soft.  ?   Tenderness: There is no abdominal tenderness.  ?Musculoskeletal:  ?   Cervical back: Neck supple. No tenderness.  ?   Right lower leg: No edema.  ?   Left lower leg: No edema.  ?Skin: ?   General: Skin is warm.  ?   Findings: No rash.  ?Neurological:  ?   General: No focal deficit present.  ?   Mental Status: He is alert and oriented to person, place, and time.  ?   Cranial Nerves: No cranial nerve deficit.  ?   Sensory: No sensory deficit.  ?   Motor: No weakness.  ?Psychiatric:     ?   Mood and Affect: Mood normal.     ?   Behavior: Behavior normal.  ? ? ?BP 136/84 (BP Location: Right Arm, Patient Position: Sitting, Cuff Size: Normal)   Pulse 73   Resp 18   Ht _0  (1.626 m)   Wt 196 lb 12.8 oz (89.3 kg)   SpO2 97%   BMI 33.78 kg/m?  ?Wt Readings from Last 3 Encounters:  ?11/21/21 196 lb 12.8 oz (89.3 kg)  ?05/31/21 202 lb (91.6 kg)  ?05/29/21 202 lb (91.6 kg)  ? ? ?No results found for: TSH ?Lab Results  ?Component Value Date  ? WBC 6.6 05/30/2021  ? HGB 16.8 05/30/2021  ? HCT 48.8 05/30/2021  ? MCV 93 05/30/2021  ? PLT 277 05/30/2021  ? ?Lab Results  ?Component Value Date  ? NA 142 05/30/2021  ? K 4.4 05/30/2021  ? CO2 25 05/30/2021  ? GLUCOSE 100 (H) 05/30/2021  ? BUN 17 05/30/2021  ? CREATININE 1.03 05/30/2021  ? BILITOT 0.5 05/30/2021  ? ALKPHOS 82 05/30/2021  ? AST 23 05/30/2021  ? ALT 33 05/30/2021  ? PROT 6.7 05/30/2021  ? ALBUMIN 4.8 05/30/2021  ? CALCIUM 9.0 05/30/2021  ? EGFR 84 05/30/2021  ? ?Lab Results  ?Component Value Date  ? CHOL 190 05/30/2021  ? ?Lab Results  ?Component Value Date  ? HDL 37 (L) 05/30/2021  ? ?Lab Results  ?Component Value Date  ? LDLCALC 116 (H) 05/30/2021  ? ?Lab Results  ?Component Value Date  ? TRIG 213 (H) 05/30/2021  ? ?Lab Results  ?Component Value Date  ? CHOLHDL 5.1 (H) 05/30/2021  ? ?No results found for:  HGBA1C ? ?  ?Assessment & Plan:  ? ?Problem List Items Addressed This Visit   ? ?  ? Cardiovascular and Mediastinum  ? Essential hypertension  ?  BP Readings from Last 1 Encounters:  ?11/21/21 136/84  ?Well-controlled with Lisinopril-HCTZ 20-25 mg QD ?Counseled for  compliance with the medications ?Advised DASH diet and moderate exercise/walking, at least 150 mins/week ? ?  ?  ?  ? Digestive  ? Gastroesophageal reflux disease without esophagitis  ?  Well-controlled with Omeprazole ? ?  ?  ? Relevant Medications  ? omeprazole (PRILOSEC) 20 MG capsule  ?  ? Other  ? Mixed hyperlipidemia  ?  Restarted Simvastatin ?Check lipid profile ? ?  ?  ? Relevant Orders  ? Lipid panel  ? Tobacco abuse  ?  Smokes less than 1 pack/day, recently started ? ?Asked about quitting: confirms that he currently smokes cigarettes ?Advise to quit smoking: Educated about QUITTING to reduce the risk of cancer, cardio and cerebrovascular disease. ?Assess willingness: Unwilling to quit at this time, but is working on cutting back. ?Assist with counseling and pharmacotherapy: Counseled for 5 minutes and literature provided. ?Arrange for follow up: Follow up in 3 months and continue to offer help. ?  ?  ? Encounter for general adult medical examination with abnormal findings - Primary  ?  Physical exam as documented. ?Fasting blood tests today. ?Had Colonoscopy in [place], will request records. ? ?  ?  ? Relevant Orders  ? TSH  ? Lipid panel  ? Hemoglobin A1c  ? CMP14+EGFR  ? CBC with Differential/Platelet  ? Prostate cancer screening  ?  Ordered PSA after discussing its limitations for prostate cancer screening, including false positive results leading additional investigations. ? ?  ?  ? Relevant Orders  ? PSA  ? ?Other Visit Diagnoses   ? ? Vitamin D deficiency      ? Relevant Orders  ? VITAMIN D 25 Hydroxy (Vit-D Deficiency, Fractures)  ? ?  ? ? ?Meds ordered this encounter  ?Medications  ? omeprazole (PRILOSEC) 20 MG capsule  ?  Sig: Take 1 capsule  (20 mg total) by mouth daily.  ?  Dispense:  30 capsule  ?  Refill:  5  ? ? ?Follow-up: Return in about 6 months (around 05/24/2022) for HTN, HLD and second dose of Shingrix.  ? ? ?Lindell Spar, MD ?

## 2021-11-21 NOTE — Assessment & Plan Note (Signed)
Smokes less than 1 pack/day, recently started ? ?Asked about quitting: confirms that he currently smokes cigarettes ?Advise to quit smoking: Educated about QUITTING to reduce the risk of cancer, cardio and cerebrovascular disease. ?Assess willingness: Unwilling to quit at this time, but is working on cutting back. ?Assist with counseling and pharmacotherapy: Counseled for 5 minutes and literature provided. ?Arrange for follow up: Follow up in 3 months and continue to offer help. ?

## 2021-11-21 NOTE — Addendum Note (Signed)
Addended by: Zacarias Pontes R on: 11/21/2021 09:56 AM ? ? Modules accepted: Orders ? ?

## 2021-11-21 NOTE — Assessment & Plan Note (Addendum)
Physical exam as documented. ?Fasting blood tests today. ?Had Colonoscopy in [place], will request records. ?

## 2021-11-21 NOTE — Assessment & Plan Note (Signed)
BP Readings from Last 1 Encounters:  ?11/21/21 136/84  ? ?Well-controlled with Lisinopril-HCTZ 20-25 mg QD ?Counseled for compliance with the medications ?Advised DASH diet and moderate exercise/walking, at least 150 mins/week ? ?

## 2021-11-21 NOTE — Assessment & Plan Note (Signed)
Well-controlled with Omeprazole 

## 2021-11-21 NOTE — Patient Instructions (Addendum)
Please continue taking medications as prescribed.  Please continue to follow DASH diet and perform moderate exercise/walking at least 150 mins/week. 

## 2021-11-22 DIAGNOSIS — L821 Other seborrheic keratosis: Secondary | ICD-10-CM | POA: Diagnosis not present

## 2021-11-22 DIAGNOSIS — D234 Other benign neoplasm of skin of scalp and neck: Secondary | ICD-10-CM | POA: Diagnosis not present

## 2021-11-22 DIAGNOSIS — L570 Actinic keratosis: Secondary | ICD-10-CM | POA: Diagnosis not present

## 2021-11-22 DIAGNOSIS — X32XXXA Exposure to sunlight, initial encounter: Secondary | ICD-10-CM | POA: Diagnosis not present

## 2021-11-22 LAB — CBC WITH DIFFERENTIAL/PLATELET
Basophils Absolute: 0.1 10*3/uL (ref 0.0–0.2)
Basos: 1 %
EOS (ABSOLUTE): 0.4 10*3/uL (ref 0.0–0.4)
Eos: 5 %
Hematocrit: 46.6 % (ref 37.5–51.0)
Hemoglobin: 15.9 g/dL (ref 13.0–17.7)
Immature Grans (Abs): 0 10*3/uL (ref 0.0–0.1)
Immature Granulocytes: 0 %
Lymphocytes Absolute: 2.1 10*3/uL (ref 0.7–3.1)
Lymphs: 25 %
MCH: 31.7 pg (ref 26.6–33.0)
MCHC: 34.1 g/dL (ref 31.5–35.7)
MCV: 93 fL (ref 79–97)
Monocytes Absolute: 0.9 10*3/uL (ref 0.1–0.9)
Monocytes: 10 %
Neutrophils Absolute: 5 10*3/uL (ref 1.4–7.0)
Neutrophils: 59 %
Platelets: 300 10*3/uL (ref 150–450)
RBC: 5.01 x10E6/uL (ref 4.14–5.80)
RDW: 11.9 % (ref 11.6–15.4)
WBC: 8.4 10*3/uL (ref 3.4–10.8)

## 2021-11-22 LAB — CMP14+EGFR
ALT: 36 IU/L (ref 0–44)
AST: 29 IU/L (ref 0–40)
Albumin/Globulin Ratio: 1.8 (ref 1.2–2.2)
Albumin: 4.6 g/dL (ref 3.8–4.9)
Alkaline Phosphatase: 75 IU/L (ref 44–121)
BUN/Creatinine Ratio: 19 (ref 9–20)
BUN: 22 mg/dL (ref 6–24)
Bilirubin Total: 0.5 mg/dL (ref 0.0–1.2)
CO2: 23 mmol/L (ref 20–29)
Calcium: 9.6 mg/dL (ref 8.7–10.2)
Chloride: 101 mmol/L (ref 96–106)
Creatinine, Ser: 1.17 mg/dL (ref 0.76–1.27)
Globulin, Total: 2.5 g/dL (ref 1.5–4.5)
Glucose: 96 mg/dL (ref 70–99)
Potassium: 4.1 mmol/L (ref 3.5–5.2)
Sodium: 140 mmol/L (ref 134–144)
Total Protein: 7.1 g/dL (ref 6.0–8.5)
eGFR: 72 mL/min/{1.73_m2} (ref >59)

## 2021-11-22 LAB — LIPID PANEL
Chol/HDL Ratio: 5.5 ratio — ABNORMAL HIGH (ref 0.0–5.0)
Cholesterol, Total: 199 mg/dL (ref 100–199)
HDL: 36 mg/dL — ABNORMAL LOW (ref >39)
LDL Chol Calc (NIH): 126 mg/dL — ABNORMAL HIGH (ref 0–99)
Triglycerides: 206 mg/dL — ABNORMAL HIGH (ref 0–149)
VLDL Cholesterol Cal: 37 mg/dL (ref 5–40)

## 2021-11-22 LAB — PSA: Prostate Specific Ag, Serum: 1.4 ng/mL (ref 0.0–4.0)

## 2021-11-22 LAB — HEMOGLOBIN A1C
Est. average glucose Bld gHb Est-mCnc: 114 mg/dL
Hgb A1c MFr Bld: 5.6 % (ref 4.8–5.6)

## 2021-11-22 LAB — VITAMIN D 25 HYDROXY (VIT D DEFICIENCY, FRACTURES): Vit D, 25-Hydroxy: 51.3 ng/mL (ref 30.0–100.0)

## 2021-11-22 LAB — TSH: TSH: 2.7 u[IU]/mL (ref 0.450–4.500)

## 2021-11-23 ENCOUNTER — Encounter: Payer: Self-pay | Admitting: *Deleted

## 2022-02-11 ENCOUNTER — Other Ambulatory Visit: Payer: Self-pay | Admitting: Internal Medicine

## 2022-02-11 DIAGNOSIS — E782 Mixed hyperlipidemia: Secondary | ICD-10-CM

## 2022-04-17 ENCOUNTER — Other Ambulatory Visit: Payer: Self-pay | Admitting: Internal Medicine

## 2022-04-17 DIAGNOSIS — I10 Essential (primary) hypertension: Secondary | ICD-10-CM

## 2022-05-18 ENCOUNTER — Other Ambulatory Visit: Payer: Self-pay | Admitting: Internal Medicine

## 2022-05-18 DIAGNOSIS — K219 Gastro-esophageal reflux disease without esophagitis: Secondary | ICD-10-CM

## 2022-05-24 ENCOUNTER — Ambulatory Visit: Payer: BC Managed Care – PPO | Admitting: Internal Medicine

## 2022-06-18 ENCOUNTER — Other Ambulatory Visit: Payer: Self-pay | Admitting: Internal Medicine

## 2022-06-18 DIAGNOSIS — E782 Mixed hyperlipidemia: Secondary | ICD-10-CM

## 2022-06-27 ENCOUNTER — Ambulatory Visit: Payer: BC Managed Care – PPO | Admitting: Internal Medicine

## 2022-07-10 ENCOUNTER — Ambulatory Visit (INDEPENDENT_AMBULATORY_CARE_PROVIDER_SITE_OTHER): Payer: BC Managed Care – PPO | Admitting: Internal Medicine

## 2022-07-10 ENCOUNTER — Encounter: Payer: Self-pay | Admitting: Internal Medicine

## 2022-07-10 VITALS — BP 126/74 | HR 74 | Ht 64.0 in | Wt 193.4 lb

## 2022-07-10 DIAGNOSIS — Z23 Encounter for immunization: Secondary | ICD-10-CM | POA: Diagnosis not present

## 2022-07-10 DIAGNOSIS — Z2821 Immunization not carried out because of patient refusal: Secondary | ICD-10-CM

## 2022-07-10 DIAGNOSIS — Z720 Tobacco use: Secondary | ICD-10-CM

## 2022-07-10 DIAGNOSIS — E782 Mixed hyperlipidemia: Secondary | ICD-10-CM | POA: Diagnosis not present

## 2022-07-10 DIAGNOSIS — I10 Essential (primary) hypertension: Secondary | ICD-10-CM

## 2022-07-10 DIAGNOSIS — F17210 Nicotine dependence, cigarettes, uncomplicated: Secondary | ICD-10-CM | POA: Diagnosis not present

## 2022-07-10 NOTE — Assessment & Plan Note (Addendum)
On Simvastatin Check lipid profile 

## 2022-07-10 NOTE — Patient Instructions (Signed)
Please continue taking medications as prescribed.  Please continue to follow low salt diet and perform moderate exercise/walking at least 150 mins/week. 

## 2022-07-10 NOTE — Assessment & Plan Note (Signed)
BP Readings from Last 1 Encounters:  07/10/22 126/74   Well-controlled with Lisinopril-HCTZ 20-25 mg QD Counseled for compliance with the medications Advised DASH diet and moderate exercise/walking, at least 150 mins/week

## 2022-07-10 NOTE — Progress Notes (Unsigned)
Established Patient Office Visit  Subjective:  Patient ID: Alexander Henson, male    DOB: 1961/05/16  Age: 62 y.o. MRN: [id number]  CC:  Chief Complaint  Patient presents with   Follow-up    For hypertension.     HPI Alexander Henson is a 62 y.o. male with past medical history of HTN and HLD who presents for f/u of his chronic medical conditions.  HTN: BP is well-controlled. Takes medications regularly. Patient denies headache, dizziness, chest pain, dyspnea or palpitations.   He c/o leg swelling after standing for 10 hours on a concrete floor at work. Denies any orthopnea or PND. His swelling improves after leg elevation at nighttime.   He received second dose of Shingrix vaccine today.    Past Medical History:  Diagnosis Date   Hyperlipidemia    Hypertension     History reviewed. No pertinent surgical history.  History reviewed. No pertinent family history.  Social History   Socioeconomic History   Marital status: Single    Spouse name: Not on file   Number of children: Not on file   Years of education: Not on file   Highest education level: Not on file  Occupational History   Not on file  Tobacco Use   Smoking status: Some Days    Types: Cigarettes   Smokeless tobacco: Never  Substance and Sexual Activity   Alcohol use: Not Currently   Drug use: Never   Sexual activity: Not on file  Other Topics Concern   Not on file  Social History Narrative   Not on file   Social Determinants of Health   Financial Resource Strain: Not on file  Food Insecurity: Not on file  Transportation Needs: Not on file  Physical Activity: Not on file  Stress: Not on file  Social Connections: Not on file  Intimate Partner Violence: Not on file    Outpatient Medications Prior to Visit  Medication Sig Dispense Refill   fluticasone (FLONASE) 50 MCG/ACT nasal spray Place 2 sprays into both nostrils daily. 16 g 6   lisinopril-hydrochlorothiazide (ZESTORETIC) 20-25 MG tablet TAKE 1  TABLET BY MOUTH DAILY 30 tablet 5   omeprazole (PRILOSEC) 20 MG capsule TAKE 1 CAPSULE(20 MG) BY MOUTH DAILY 30 capsule 5   simvastatin (ZOCOR) 20 MG tablet TAKE 1 TABLET(20 MG) BY MOUTH AT BEDTIME 30 tablet 2   No facility-administered medications prior to visit.    No Known Allergies  ROS Review of Systems  Constitutional:  Negative for chills and fever.  HENT:  Negative for congestion and sore throat.   Eyes:  Negative for pain and discharge.  Respiratory:  Negative for cough and shortness of breath.   Cardiovascular:  Negative for chest pain and palpitations.  Gastrointestinal:  Negative for constipation, diarrhea, nausea and vomiting.  Endocrine: Negative for polydipsia and polyuria.  Genitourinary:  Negative for dysuria and hematuria.  Musculoskeletal:  Negative for neck pain and neck stiffness.  Skin:  Negative for rash.  Neurological:  Negative for dizziness, weakness and numbness.  Psychiatric/Behavioral:  Negative for agitation and behavioral problems.       Objective:    Physical Exam Vitals reviewed.  Constitutional:      General: He is not in acute distress.    Appearance: He is obese. He is not diaphoretic.  HENT:     Head: Normocephalic and atraumatic.     Nose: Nose normal.     Mouth/Throat:     Mouth: Mucous membranes are moist.  Eyes:     General: No scleral icterus.    Extraocular Movements: Extraocular movements intact.  Neck:     Vascular: No carotid bruit.  Cardiovascular:     Rate and Rhythm: Normal rate and regular rhythm.     Pulses: Normal pulses.     Heart sounds: Normal heart sounds. No murmur heard. Pulmonary:     Breath sounds: Normal breath sounds. No wheezing or rales.  Musculoskeletal:     Cervical back: Neck supple. No tenderness.     Right lower leg: No edema.     Left lower leg: No edema.  Skin:    General: Skin is warm.     Findings: No rash.  Neurological:     General: No focal deficit present.     Mental Status: He is  alert and oriented to person, place, and time.     Sensory: No sensory deficit.     Motor: No weakness.  Psychiatric:        Mood and Affect: Mood normal.        Behavior: Behavior normal.     BP 126/74 (BP Location: Left Arm, Cuff Size: Normal)   Pulse 74   Ht _0  (1.626 m)   Wt 193 lb 6.4 oz (87.7 kg)   SpO2 98%   BMI 33.20 kg/m  Wt Readings from Last 3 Encounters:  07/10/22 193 lb 6.4 oz (87.7 kg)  11/21/21 196 lb 12.8 oz (89.3 kg)  05/31/21 202 lb (91.6 kg)    Lab Results  Component Value Date   TSH 2.700 11/21/2021   Lab Results  Component Value Date   WBC 8.4 11/21/2021   HGB 15.9 11/21/2021   HCT 46.6 11/21/2021   MCV 93 11/21/2021   PLT 300 11/21/2021   Lab Results  Component Value Date   NA 137 07/10/2022   K 4.2 07/10/2022   CO2 25 07/10/2022   GLUCOSE 90 07/10/2022   BUN 16 07/10/2022   CREATININE 0.95 07/10/2022   BILITOT 0.3 07/10/2022   ALKPHOS 85 07/10/2022   AST 21 07/10/2022   ALT 36 07/10/2022   PROT 7.1 07/10/2022   ALBUMIN 4.8 07/10/2022   CALCIUM 10.1 07/10/2022   EGFR 92 07/10/2022   Lab Results  Component Value Date   CHOL 199 07/10/2022   Lab Results  Component Value Date   HDL 33 (L) 07/10/2022   Lab Results  Component Value Date   LDLCALC 93 07/10/2022   Lab Results  Component Value Date   TRIG 441 (H) 07/10/2022   Lab Results  Component Value Date   CHOLHDL 6.0 (H) 07/10/2022   Lab Results  Component Value Date   HGBA1C 5.6 11/21/2021      Assessment & Plan:   Problem List Items Addressed This Visit       Cardiovascular and Mediastinum   Essential hypertension - Primary    BP Readings from Last 1 Encounters:  07/10/22 126/74  Well-controlled with Lisinopril-HCTZ 20-25 mg QD Counseled for compliance with the medications Advised DASH diet and moderate exercise/walking, at least 150 mins/week      Relevant Orders   CMP14+EGFR (Completed)     Other   Mixed hyperlipidemia    On Simvastatin Check  lipid profile      Relevant Orders   Lipid Profile (Completed)   Tobacco abuse    Smokes less than 0.5 pack/day, recently started  Asked about quitting: confirms that he currently smokes cigarettes Advise to quit smoking: Educated  about QUITTING to reduce the risk of cancer, cardio and cerebrovascular disease. Assess willingness: Unwilling to quit at this time, but is working on cutting back. Assist with counseling and pharmacotherapy: Counseled for 5 minutes and literature provided. Arrange for follow up: Follow up in 3 months and continue to offer help.      Refused influenza vaccine   Other Visit Diagnoses     Need for zoster vaccination       Relevant Orders   Varicella-zoster vaccine IM (Completed)       No orders of the defined types were placed in this encounter.   Follow-up: Return in about 6 months (around 01/08/2023) for Annual physical.    Lindell Spar, MD

## 2022-07-10 NOTE — Assessment & Plan Note (Addendum)
Smokes less than 0.5 pack/day, recently started  Asked about quitting: confirms that he currently smokes cigarettes Advise to quit smoking: Educated about QUITTING to reduce the risk of cancer, cardio and cerebrovascular disease. Assess willingness: Unwilling to quit at this time, but is working on cutting back. Assist with counseling and pharmacotherapy: Counseled for 5 minutes and literature provided. Arrange for follow up: Follow up in 3 months and continue to offer help.

## 2022-07-11 LAB — CMP14+EGFR
ALT: 36 IU/L (ref 0–44)
AST: 21 IU/L (ref 0–40)
Albumin/Globulin Ratio: 2.1 (ref 1.2–2.2)
Albumin: 4.8 g/dL (ref 3.8–4.9)
Alkaline Phosphatase: 85 IU/L (ref 44–121)
BUN/Creatinine Ratio: 17 (ref 10–24)
BUN: 16 mg/dL (ref 8–27)
Bilirubin Total: 0.3 mg/dL (ref 0.0–1.2)
CO2: 25 mmol/L (ref 20–29)
Calcium: 10.1 mg/dL (ref 8.6–10.2)
Chloride: 98 mmol/L (ref 96–106)
Creatinine, Ser: 0.95 mg/dL (ref 0.76–1.27)
Globulin, Total: 2.3 g/dL (ref 1.5–4.5)
Glucose: 90 mg/dL (ref 70–99)
Potassium: 4.2 mmol/L (ref 3.5–5.2)
Sodium: 137 mmol/L (ref 134–144)
Total Protein: 7.1 g/dL (ref 6.0–8.5)
eGFR: 92 mL/min/{1.73_m2} (ref >59)

## 2022-07-11 LAB — LIPID PANEL
Chol/HDL Ratio: 6 ratio — ABNORMAL HIGH (ref 0.0–5.0)
Cholesterol, Total: 199 mg/dL (ref 100–199)
HDL: 33 mg/dL — ABNORMAL LOW (ref >39)
LDL Chol Calc (NIH): 93 mg/dL (ref 0–99)
Triglycerides: 441 mg/dL — ABNORMAL HIGH (ref 0–149)
VLDL Cholesterol Cal: 73 mg/dL — ABNORMAL HIGH (ref 5–40)

## 2022-07-12 ENCOUNTER — Encounter: Payer: Self-pay | Admitting: Internal Medicine

## 2022-09-14 ENCOUNTER — Other Ambulatory Visit: Payer: Self-pay | Admitting: Internal Medicine

## 2022-09-14 DIAGNOSIS — E782 Mixed hyperlipidemia: Secondary | ICD-10-CM

## 2022-10-15 ENCOUNTER — Other Ambulatory Visit: Payer: Self-pay | Admitting: Internal Medicine

## 2022-10-15 DIAGNOSIS — I10 Essential (primary) hypertension: Secondary | ICD-10-CM

## 2022-11-14 ENCOUNTER — Other Ambulatory Visit: Payer: Self-pay | Admitting: Internal Medicine

## 2022-11-14 DIAGNOSIS — K219 Gastro-esophageal reflux disease without esophagitis: Secondary | ICD-10-CM

## 2022-12-10 ENCOUNTER — Other Ambulatory Visit: Payer: Self-pay | Admitting: Internal Medicine

## 2022-12-10 DIAGNOSIS — K219 Gastro-esophageal reflux disease without esophagitis: Secondary | ICD-10-CM

## 2022-12-12 ENCOUNTER — Other Ambulatory Visit: Payer: Self-pay | Admitting: Internal Medicine

## 2022-12-12 DIAGNOSIS — E782 Mixed hyperlipidemia: Secondary | ICD-10-CM

## 2022-12-25 ENCOUNTER — Encounter: Payer: BC Managed Care – PPO | Admitting: Internal Medicine

## 2023-01-03 ENCOUNTER — Ambulatory Visit: Payer: BC Managed Care – PPO | Admitting: Internal Medicine

## 2023-01-03 ENCOUNTER — Encounter: Payer: Self-pay | Admitting: Internal Medicine

## 2023-01-03 VITALS — BP 124/74 | HR 78 | Ht 64.0 in | Wt 191.2 lb

## 2023-01-03 DIAGNOSIS — R7303 Prediabetes: Secondary | ICD-10-CM | POA: Diagnosis not present

## 2023-01-03 DIAGNOSIS — Z125 Encounter for screening for malignant neoplasm of prostate: Secondary | ICD-10-CM | POA: Diagnosis not present

## 2023-01-03 DIAGNOSIS — E782 Mixed hyperlipidemia: Secondary | ICD-10-CM | POA: Diagnosis not present

## 2023-01-03 DIAGNOSIS — I10 Essential (primary) hypertension: Secondary | ICD-10-CM

## 2023-01-03 DIAGNOSIS — E559 Vitamin D deficiency, unspecified: Secondary | ICD-10-CM

## 2023-01-03 DIAGNOSIS — D229 Melanocytic nevi, unspecified: Secondary | ICD-10-CM | POA: Insufficient documentation

## 2023-01-03 DIAGNOSIS — K219 Gastro-esophageal reflux disease without esophagitis: Secondary | ICD-10-CM

## 2023-01-03 DIAGNOSIS — L729 Follicular cyst of the skin and subcutaneous tissue, unspecified: Secondary | ICD-10-CM

## 2023-01-03 DIAGNOSIS — Z0001 Encounter for general adult medical examination with abnormal findings: Secondary | ICD-10-CM

## 2023-01-03 NOTE — Assessment & Plan Note (Signed)
Small, oval cyst over the dorsal aspect of left forearm Reassured it being benign - has had dermatology evaluation

## 2023-01-03 NOTE — Progress Notes (Signed)
Established Patient Office Visit  Subjective:  Patient ID: Alexander Henson, male    DOB: 07/03/1961  Age: 62 y.o. MRN: [id number]  CC:  Chief Complaint  Patient presents with   Annual Exam    Patient has recently buried his friend , then found out his brother Is in the hospital , patient feels he is under a lot of stress.     HPI Alexander Henson is a 62 y.o. male with past medical history of HTN and HLD who presents for annual physical.  He has been under a lot of stress as he lost his friend recently due to a stroke.  He also found out in the last week that he is brother was admitted at hospital due to a TIA.  His BP was elevated initially, but improved later.  HTN: BP is well-controlled overall. Takes medications regularly. Patient denies headache, dizziness, chest pain, dyspnea or palpitations.  He c/o leg swelling after standing for 10 hours on a concrete floor at work. Denies any orthopnea or PND. His swelling improves after leg elevation at nighttime.    Past Medical History:  Diagnosis Date   Hyperlipidemia    Hypertension     History reviewed. No pertinent surgical history.  History reviewed. No pertinent family history.  Social History   Socioeconomic History   Marital status: Single    Spouse name: Not on file   Number of children: Not on file   Years of education: Not on file   Highest education level: Not on file  Occupational History   Not on file  Tobacco Use   Smoking status: Some Days    Types: Cigarettes   Smokeless tobacco: Never  Substance and Sexual Activity   Alcohol use: Not Currently   Drug use: Never   Sexual activity: Not on file  Other Topics Concern   Not on file  Social History Narrative   Not on file   Social Determinants of Health   Financial Resource Strain: Not on file  Food Insecurity: Not on file  Transportation Needs: Not on file  Physical Activity: Not on file  Stress: Not on file  Social Connections: Not on file   Intimate Partner Violence: Not on file    Outpatient Medications Prior to Visit  Medication Sig Dispense Refill   fluticasone (FLONASE) 50 MCG/ACT nasal spray Place 2 sprays into both nostrils daily. 16 g 6   lisinopril-hydrochlorothiazide (ZESTORETIC) 20-25 MG tablet TAKE 1 TABLET BY MOUTH DAILY 30 tablet 5   omeprazole (PRILOSEC) 20 MG capsule TAKE 1 CAPSULE(20 MG) BY MOUTH DAILY 30 capsule 5   simvastatin (ZOCOR) 20 MG tablet TAKE 1 TABLET(20 MG) BY MOUTH AT BEDTIME 30 tablet 2   No facility-administered medications prior to visit.    No Known Allergies  ROS Review of Systems  Constitutional:  Negative for chills and fever.  HENT:  Negative for congestion and sore throat.   Eyes:  Negative for pain and discharge.  Respiratory:  Negative for cough and shortness of breath.   Cardiovascular:  Negative for chest pain and palpitations.  Gastrointestinal:  Negative for constipation, diarrhea, nausea and vomiting.  Endocrine: Negative for polydipsia and polyuria.  Genitourinary:  Negative for dysuria and hematuria.  Musculoskeletal:  Negative for neck pain and neck stiffness.  Skin:  Negative for rash.  Neurological:  Negative for dizziness, weakness and numbness.  Psychiatric/Behavioral:  Negative for agitation and behavioral problems.       Objective:    Physical  Exam Vitals reviewed.  Constitutional:      General: He is not in acute distress.    Appearance: He is obese. He is not diaphoretic.  HENT:     Head: Normocephalic and atraumatic.     Nose: Nose normal.     Mouth/Throat:     Mouth: Mucous membranes are moist.  Eyes:     General: No scleral icterus.    Extraocular Movements: Extraocular movements intact.  Neck:     Vascular: No carotid bruit.  Cardiovascular:     Rate and Rhythm: Normal rate and regular rhythm.     Pulses: Normal pulses.     Heart sounds: Normal heart sounds. No murmur heard. Pulmonary:     Breath sounds: Normal breath sounds. No wheezing  or rales.  Abdominal:     Palpations: Abdomen is soft.     Tenderness: There is no abdominal tenderness.  Musculoskeletal:     Cervical back: Neck supple. No tenderness.     Right lower leg: No edema.     Left lower leg: No edema.  Skin:    General: Skin is warm.     Findings: No rash.     Comments: Brownish mole over left side of face, near nasal bridge -about 2 cm in diameter Oval-shaped subcutaneous cyst over left forearm, nontender  Neurological:     General: No focal deficit present.     Mental Status: He is alert and oriented to person, place, and time.     Cranial Nerves: No cranial nerve deficit.     Sensory: No sensory deficit.     Motor: No weakness.  Psychiatric:        Mood and Affect: Mood normal.        Behavior: Behavior normal.     BP 124/74 (BP Location: Right Arm)   Pulse 78   Ht 5\' 4"  (1.626 m)   Wt 191 lb 3.2 oz (86.7 kg)   SpO2 95%   BMI 32.82 kg/m  Wt Readings from Last 3 Encounters:  01/03/23 191 lb 3.2 oz (86.7 kg)  07/10/22 193 lb 6.4 oz (87.7 kg)  11/21/21 196 lb 12.8 oz (89.3 kg)    Lab Results  Component Value Date   TSH 2.700 11/21/2021   Lab Results  Component Value Date   WBC 8.4 11/21/2021   HGB 15.9 11/21/2021   HCT 46.6 11/21/2021   MCV 93 11/21/2021   PLT 300 11/21/2021   Lab Results  Component Value Date   NA 137 07/10/2022   K 4.2 07/10/2022   CO2 25 07/10/2022   GLUCOSE 90 07/10/2022   BUN 16 07/10/2022   CREATININE 0.95 07/10/2022   BILITOT 0.3 07/10/2022   ALKPHOS 85 07/10/2022   AST 21 07/10/2022   ALT 36 07/10/2022   PROT 7.1 07/10/2022   ALBUMIN 4.8 07/10/2022   CALCIUM 10.1 07/10/2022   EGFR 92 07/10/2022   Lab Results  Component Value Date   CHOL 199 07/10/2022   Lab Results  Component Value Date   HDL 33 (L) 07/10/2022   Lab Results  Component Value Date   LDLCALC 93 07/10/2022   Lab Results  Component Value Date   TRIG 441 (H) 07/10/2022   Lab Results  Component Value Date   CHOLHDL 6.0  (H) 07/10/2022   Lab Results  Component Value Date   HGBA1C 5.6 11/21/2021      Assessment & Plan:   Problem List Items Addressed This Visit  Cardiovascular and Mediastinum   Essential hypertension    BP Readings from Last 1 Encounters:  01/03/23 124/74  Well-controlled with Lisinopril-HCTZ 20-25 mg QD Counseled for compliance with the medications Advised DASH diet and moderate exercise/walking, at least 150 mins/week      Relevant Orders   TSH   CMP14+EGFR   CBC with Differential/Platelet     Digestive   Gastroesophageal reflux disease without esophagitis    Well-controlled with Omeprazole        Musculoskeletal and Integument   Benign pigmented mole    Has had dermatology evaluation for it Was told of benign mole, as he reports recent increase in size, advised to get reevaluation by dermatology         Other   Mixed hyperlipidemia    On Simvastatin Check lipid profile      Relevant Orders   Lipid panel   Encounter for general adult medical examination with abnormal findings - Primary    Physical exam as documented. Fasting blood tests today. Had Colonoscopy in [place] in 2018.      Prostate cancer screening    Ordered PSA after discussing its limitations for prostate cancer screening, including false positive results leading to additional investigations.      Relevant Orders   PSA   Subcutaneous cyst    Small, oval cyst over the dorsal aspect of left forearm Reassured it being benign - has had dermatology evaluation      Other Visit Diagnoses     Vitamin D deficiency       Relevant Orders   VITAMIN D 25 Hydroxy (Vit-D Deficiency, Fractures)   Prediabetes       Relevant Orders   Hemoglobin A1c       No orders of the defined types were placed in this encounter.   Follow-up: Return in about 6 months (around 07/05/2023) for HTN and HLD.    Anabel Halon, MD

## 2023-01-03 NOTE — Assessment & Plan Note (Signed)
On Simvastatin °Check lipid profile °

## 2023-01-03 NOTE — Assessment & Plan Note (Signed)
BP Readings from Last 1 Encounters:  01/03/23 124/74   Well-controlled with Lisinopril-HCTZ 20-25 mg QD Counseled for compliance with the medications Advised DASH diet and moderate exercise/walking, at least 150 mins/week

## 2023-01-03 NOTE — Patient Instructions (Signed)
Please continue to take medications as prescribed.  Please continue to follow low salt diet and perform moderate exercise/walking at least 150 mins/week.  Please try to cut down -> quit smoking.

## 2023-01-03 NOTE — Assessment & Plan Note (Signed)
Well-controlled with Omeprazole ?

## 2023-01-03 NOTE — Assessment & Plan Note (Signed)
Has had dermatology evaluation for it Was told of benign mole, as he reports recent increase in size, advised to get reevaluation by dermatology

## 2023-01-03 NOTE — Assessment & Plan Note (Addendum)
Ordered PSA after discussing its limitations for prostate cancer screening, including false positive results leading to additional investigations. 

## 2023-01-03 NOTE — Assessment & Plan Note (Signed)
Physical exam as documented. Fasting blood tests today. Had Colonoscopy in [place] in 2018.

## 2023-01-04 ENCOUNTER — Other Ambulatory Visit: Payer: Self-pay | Admitting: Internal Medicine

## 2023-01-04 DIAGNOSIS — E782 Mixed hyperlipidemia: Secondary | ICD-10-CM

## 2023-01-04 LAB — CBC WITH DIFFERENTIAL/PLATELET
Basophils Absolute: 0.1 10*3/uL (ref 0.0–0.2)
Basos: 1 %
EOS (ABSOLUTE): 0.4 10*3/uL (ref 0.0–0.4)
Eos: 6 %
Hematocrit: 49.1 % (ref 37.5–51.0)
Hemoglobin: 16.4 g/dL (ref 13.0–17.7)
Immature Grans (Abs): 0 10*3/uL (ref 0.0–0.1)
Immature Granulocytes: 0 %
Lymphocytes Absolute: 1.7 10*3/uL (ref 0.7–3.1)
Lymphs: 26 %
MCH: 31.4 pg (ref 26.6–33.0)
MCHC: 33.4 g/dL (ref 31.5–35.7)
MCV: 94 fL (ref 79–97)
Monocytes Absolute: 0.7 10*3/uL (ref 0.1–0.9)
Monocytes: 10 %
Neutrophils Absolute: 3.9 10*3/uL (ref 1.4–7.0)
Neutrophils: 57 %
Platelets: 273 10*3/uL (ref 150–450)
RBC: 5.23 x10E6/uL (ref 4.14–5.80)
RDW: 12 % (ref 11.6–15.4)
WBC: 6.8 10*3/uL (ref 3.4–10.8)

## 2023-01-04 LAB — CMP14+EGFR
ALT: 31 IU/L (ref 0–44)
AST: 25 IU/L (ref 0–40)
Albumin: 4.4 g/dL (ref 3.9–4.9)
Alkaline Phosphatase: 77 IU/L (ref 44–121)
BUN/Creatinine Ratio: 21 (ref 10–24)
BUN: 22 mg/dL (ref 8–27)
Bilirubin Total: 0.5 mg/dL (ref 0.0–1.2)
CO2: 23 mmol/L (ref 20–29)
Calcium: 9.7 mg/dL (ref 8.6–10.2)
Chloride: 102 mmol/L (ref 96–106)
Creatinine, Ser: 1.05 mg/dL (ref 0.76–1.27)
Globulin, Total: 2.5 g/dL (ref 1.5–4.5)
Glucose: 99 mg/dL (ref 70–99)
Potassium: 4 mmol/L (ref 3.5–5.2)
Sodium: 139 mmol/L (ref 134–144)
Total Protein: 6.9 g/dL (ref 6.0–8.5)
eGFR: 81 mL/min/{1.73_m2} (ref >59)

## 2023-01-04 LAB — LIPID PANEL
Chol/HDL Ratio: 4.7 ratio (ref 0.0–5.0)
Cholesterol, Total: 182 mg/dL (ref 100–199)
HDL: 39 mg/dL — ABNORMAL LOW (ref >39)
LDL Chol Calc (NIH): 111 mg/dL — ABNORMAL HIGH (ref 0–99)
Triglycerides: 181 mg/dL — ABNORMAL HIGH (ref 0–149)
VLDL Cholesterol Cal: 32 mg/dL (ref 5–40)

## 2023-01-04 LAB — HEMOGLOBIN A1C
Est. average glucose Bld gHb Est-mCnc: 120 mg/dL
Hgb A1c MFr Bld: 5.8 % — ABNORMAL HIGH (ref 4.8–5.6)

## 2023-01-04 LAB — TSH: TSH: 2.39 u[IU]/mL (ref 0.450–4.500)

## 2023-01-04 LAB — VITAMIN D 25 HYDROXY (VIT D DEFICIENCY, FRACTURES): Vit D, 25-Hydroxy: 32.4 ng/mL (ref 30.0–100.0)

## 2023-01-04 LAB — PSA: Prostate Specific Ag, Serum: 1 ng/mL (ref 0.0–4.0)

## 2023-01-04 MED ORDER — SIMVASTATIN 40 MG PO TABS: 40.0000 mg | ORAL_TABLET | Freq: Every day | ORAL | 3 refills | Status: DC

## 2023-01-09 ENCOUNTER — Ambulatory Visit (INDEPENDENT_AMBULATORY_CARE_PROVIDER_SITE_OTHER): Payer: BC Managed Care – PPO

## 2023-01-09 DIAGNOSIS — Z23 Encounter for immunization: Secondary | ICD-10-CM

## 2023-03-07 DIAGNOSIS — B078 Other viral warts: Secondary | ICD-10-CM | POA: Diagnosis not present

## 2023-03-07 DIAGNOSIS — L820 Inflamed seborrheic keratosis: Secondary | ICD-10-CM | POA: Diagnosis not present

## 2023-03-11 IMAGING — US US RENAL
1 series · 14 of 25 positions shown · non-contrast
Comparison: None.

CLINICAL DATA: Right flank pain over the last 10 days.
Hypertension.

EXAM:
RENAL / URINARY TRACT ULTRASOUND COMPLETE

[Series 1: us renal · 14 of 73 slices shown]
[im 1/73]
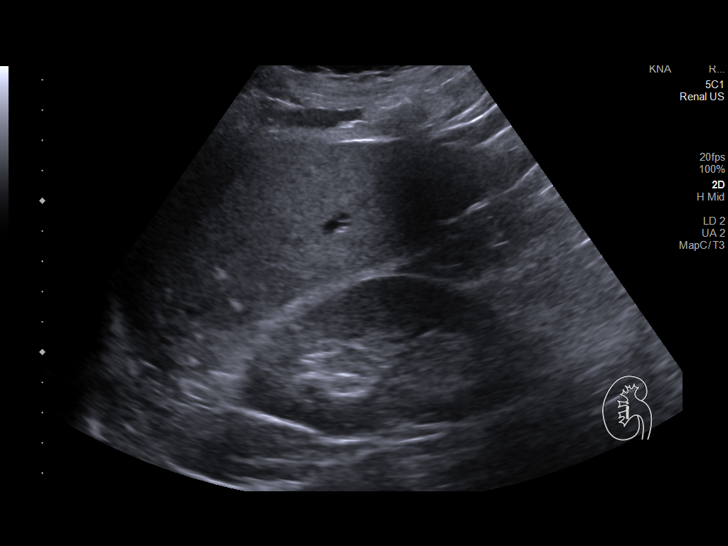
[im 7/73]
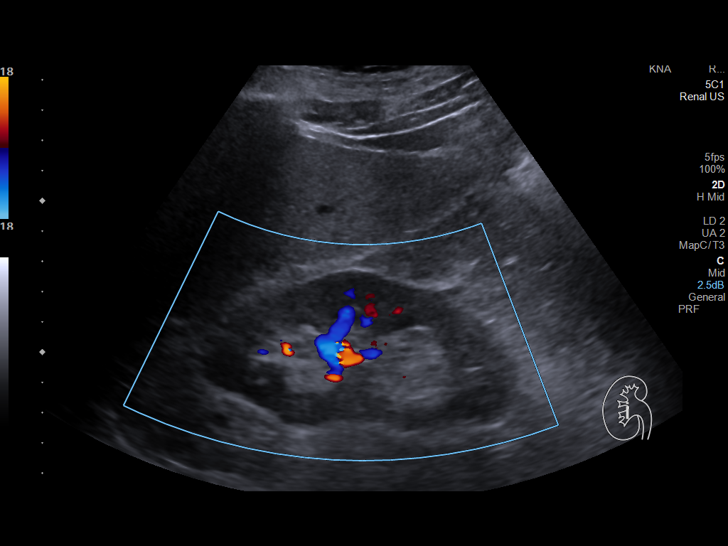
[im 13/73]
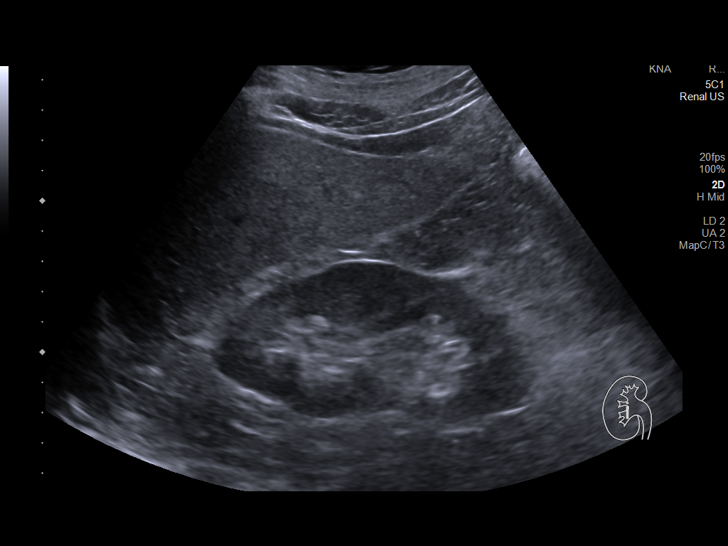
[im 19/73]
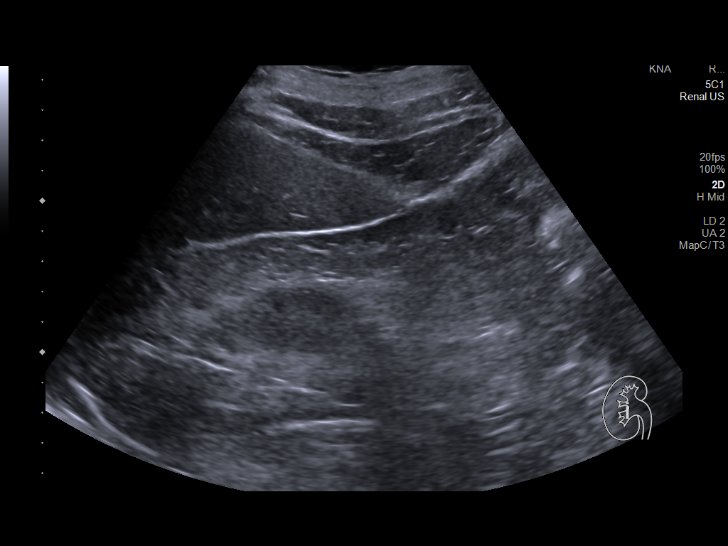
[im 25/73]
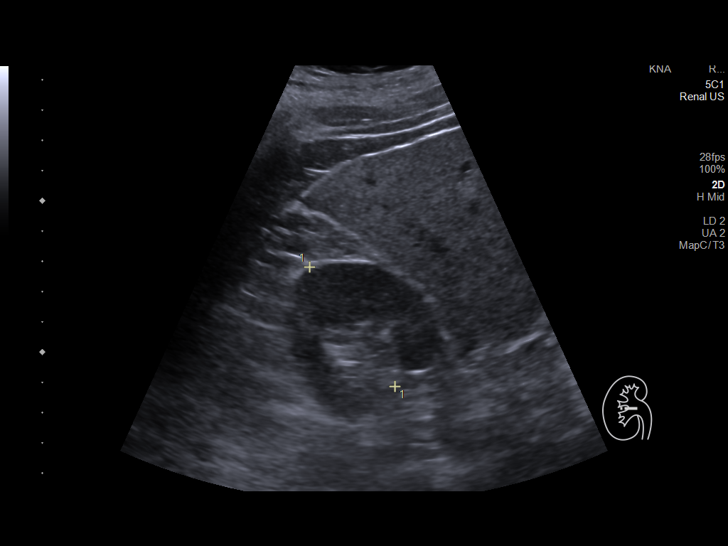
[im 28/73]
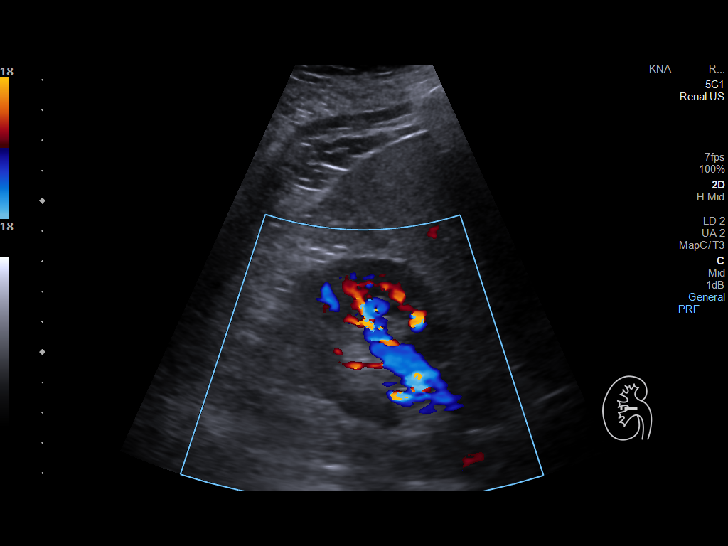
[im 34/73]
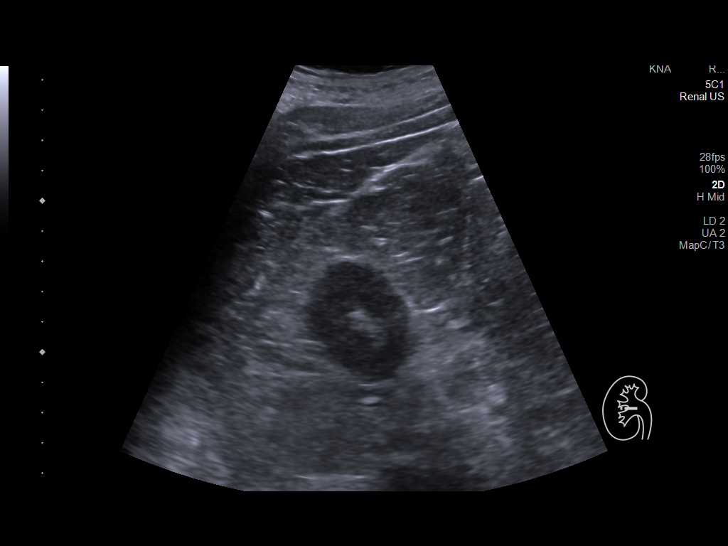
[im 40/73]
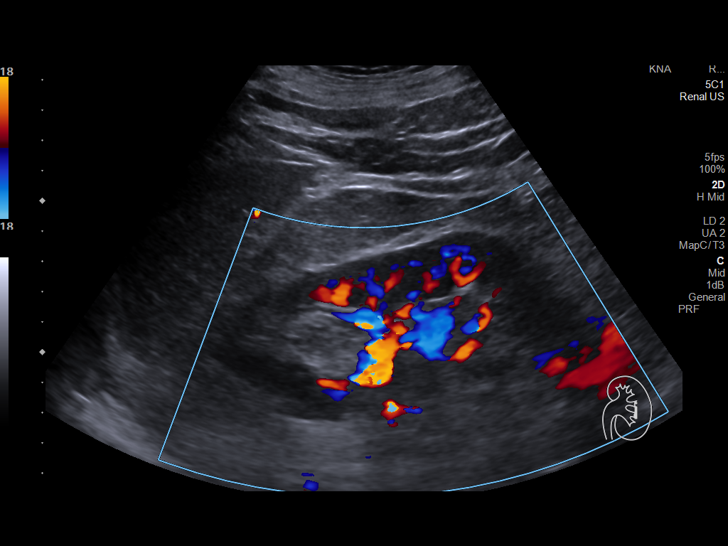
[im 46/73]
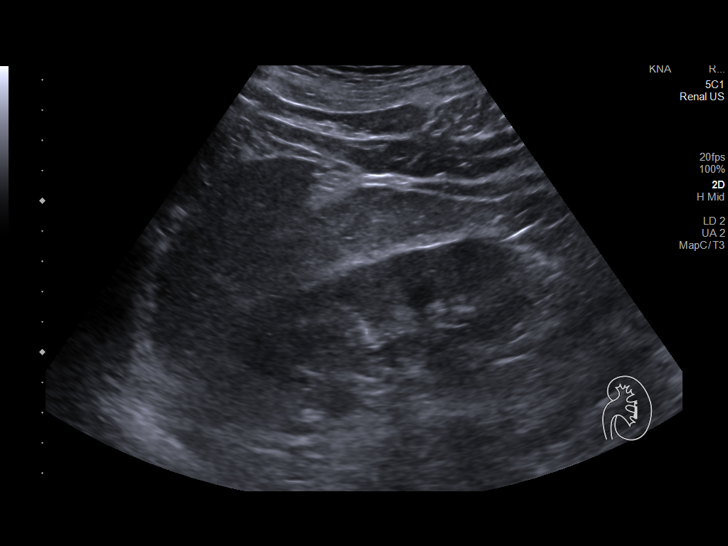
[im 49/73]
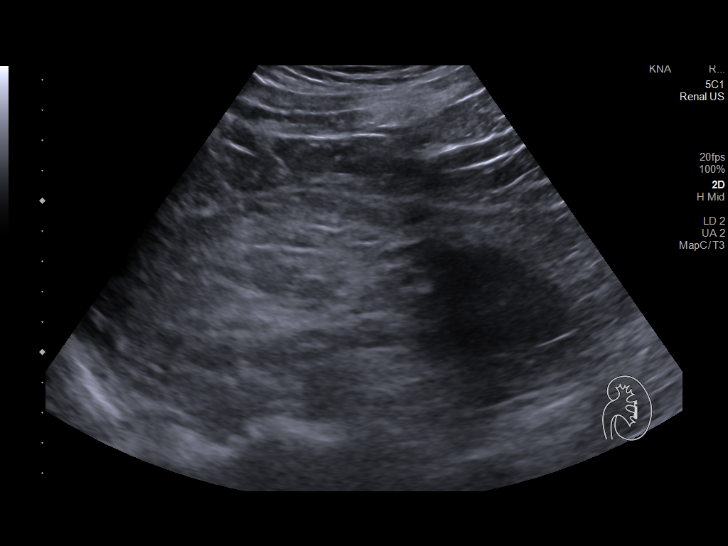
[im 55/73]
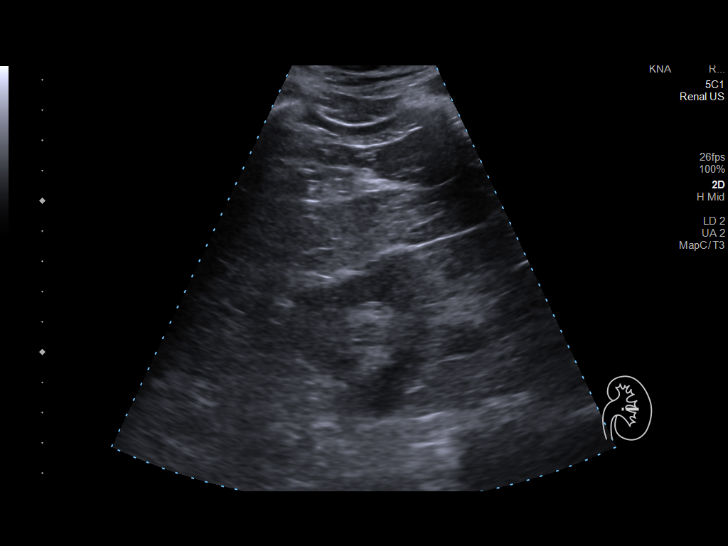
[im 61/73]
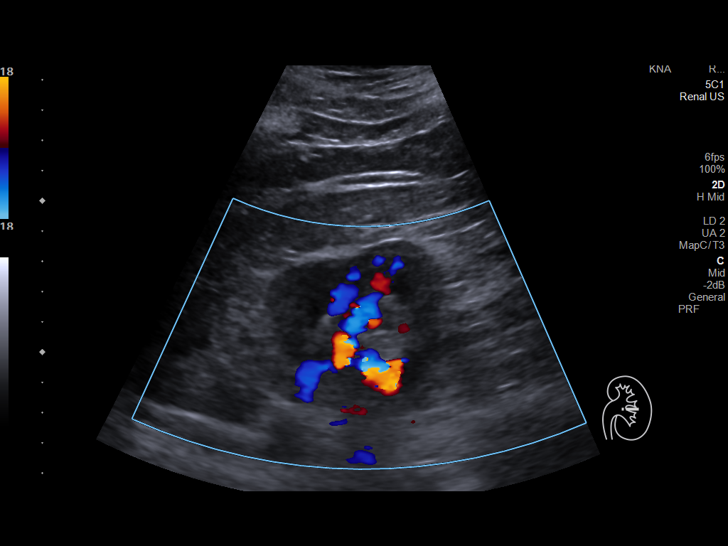
[im 67/73]
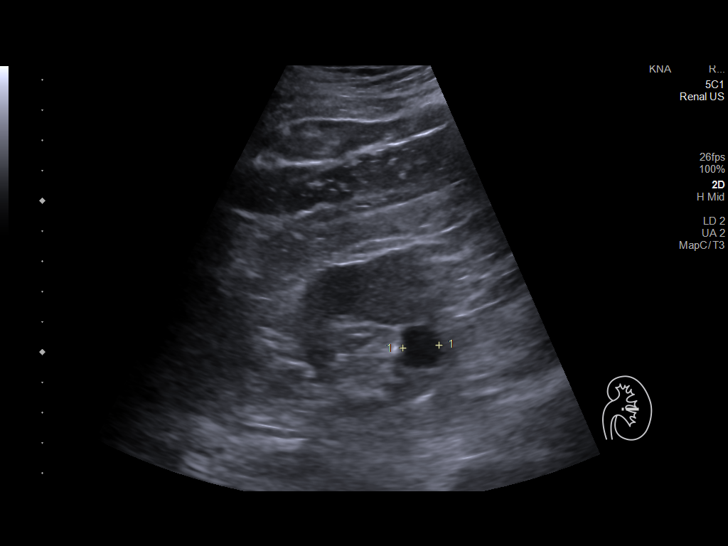
[im 73/73]
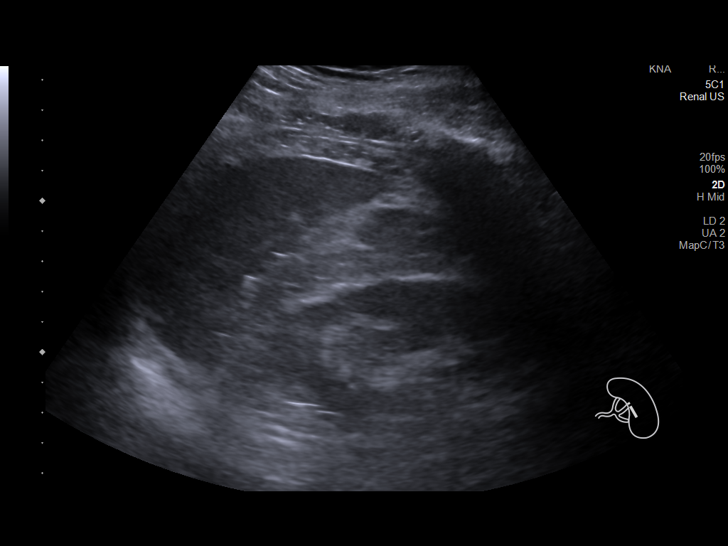

[14 of 25 positions shown; findings below may reference images not displayed]

FINDINGS: Right Kidney:

Renal measurements: 10.9 by 5.3 by 4.9 cm = volume: 146 mL.
Echogenicity within normal limits. No solid mass or hydronephrosis
visualized.

Left Kidney:

Renal measurements: 11.0 by 5.8 by 5.7 cm = volume: 193 mL.
Echogenicity within normal limits. No solid mass or hydronephrosis
visualized. 1.4 by 1.6 by 1.2 cm simple appearing cyst along the
lower pole.

Bladder:

Appears normal for degree of bladder distention. Bilateral ureteral
jets identified.

Other:

None.
IMPRESSION: 1. 1.4 cm left kidney lower pole cyst. Otherwise normal sonographic
appearance of the kidneys and urinary bladder.

## 2023-04-13 ENCOUNTER — Other Ambulatory Visit: Payer: Self-pay | Admitting: Internal Medicine

## 2023-04-13 DIAGNOSIS — I10 Essential (primary) hypertension: Secondary | ICD-10-CM

## 2023-06-11 ENCOUNTER — Other Ambulatory Visit: Payer: Self-pay | Admitting: Internal Medicine

## 2023-06-11 DIAGNOSIS — K219 Gastro-esophageal reflux disease without esophagitis: Secondary | ICD-10-CM

## 2023-06-20 DIAGNOSIS — B349 Viral infection, unspecified: Secondary | ICD-10-CM | POA: Diagnosis not present

## 2023-06-20 DIAGNOSIS — J209 Acute bronchitis, unspecified: Secondary | ICD-10-CM | POA: Diagnosis not present

## 2023-06-20 DIAGNOSIS — J019 Acute sinusitis, unspecified: Secondary | ICD-10-CM | POA: Diagnosis not present

## 2023-06-20 DIAGNOSIS — J069 Acute upper respiratory infection, unspecified: Secondary | ICD-10-CM | POA: Diagnosis not present

## 2023-07-04 DIAGNOSIS — J019 Acute sinusitis, unspecified: Secondary | ICD-10-CM | POA: Diagnosis not present

## 2023-07-04 DIAGNOSIS — J209 Acute bronchitis, unspecified: Secondary | ICD-10-CM | POA: Diagnosis not present

## 2023-07-05 ENCOUNTER — Ambulatory Visit: Payer: BC Managed Care – PPO | Admitting: Internal Medicine

## 2023-07-16 ENCOUNTER — Encounter: Payer: Self-pay | Admitting: Internal Medicine

## 2023-08-13 ENCOUNTER — Ambulatory Visit: Payer: Self-pay | Admitting: Internal Medicine

## 2023-09-26 ENCOUNTER — Encounter: Payer: Self-pay | Admitting: Internal Medicine

## 2023-09-26 ENCOUNTER — Ambulatory Visit: Payer: Self-pay | Admitting: Internal Medicine

## 2023-09-26 VITALS — BP 118/80 | HR 70 | Ht 64.0 in | Wt 195.2 lb

## 2023-09-26 DIAGNOSIS — Z125 Encounter for screening for malignant neoplasm of prostate: Secondary | ICD-10-CM | POA: Diagnosis not present

## 2023-09-26 DIAGNOSIS — E782 Mixed hyperlipidemia: Secondary | ICD-10-CM | POA: Diagnosis not present

## 2023-09-26 DIAGNOSIS — I10 Essential (primary) hypertension: Secondary | ICD-10-CM

## 2023-09-26 DIAGNOSIS — K219 Gastro-esophageal reflux disease without esophagitis: Secondary | ICD-10-CM

## 2023-09-26 DIAGNOSIS — R7303 Prediabetes: Secondary | ICD-10-CM

## 2023-09-26 DIAGNOSIS — E559 Vitamin D deficiency, unspecified: Secondary | ICD-10-CM

## 2023-09-26 MED ORDER — LISINOPRIL-HYDROCHLOROTHIAZIDE 20-25 MG PO TABS: 1.0000 | ORAL_TABLET | Freq: Every day | ORAL | 3 refills | Status: DC

## 2023-09-26 NOTE — Assessment & Plan Note (Signed)
 BP Readings from Last 1 Encounters:  09/26/23 118/80   Well-controlled with Lisinopril-HCTZ 20-25 mg QD Counseled for compliance with the medications Advised DASH diet and moderate exercise/walking, at least 150 mins/week

## 2023-09-26 NOTE — Assessment & Plan Note (Signed)
Well-controlled with Omeprazole 

## 2023-09-26 NOTE — Progress Notes (Signed)
 Established Patient Office Visit  Subjective:  Patient ID: Alexander Henson, male    DOB: 07-Mar-1961  Age: 63 y.o. MRN: [id number]  CC:  Chief Complaint  Patient presents with   Care Management    Follow up    HPI Alexander Henson is a 63 y.o. male with past medical history of HTN and HLD who presents for f/u of her chronic medical conditions.  HTN: BP is well-controlled overall. Takes medications regularly. Patient denies headache, dizziness, chest pain, dyspnea or palpitations.  He c/o leg swelling after standing for 10 hours on a concrete floor at work. Denies any orthopnea or PND. His swelling improves after leg elevation at nighttime.    Past Medical History:  Diagnosis Date   Hyperlipidemia    Hypertension     History reviewed. No pertinent surgical history.  History reviewed. No pertinent family history.  Social History   Socioeconomic History   Marital status: Single    Spouse name: Not on file   Number of children: Not on file   Years of education: Not on file   Highest education level: Not on file  Occupational History   Not on file  Tobacco Use   Smoking status: Some Days    Types: Cigarettes   Smokeless tobacco: Never  Substance and Sexual Activity   Alcohol use: Not Currently   Drug use: Never   Sexual activity: Not on file  Other Topics Concern   Not on file  Social History Narrative   Not on file   Social Drivers of Health   Financial Resource Strain: Not on file  Food Insecurity: Not on file  Transportation Needs: Not on file  Physical Activity: Not on file  Stress: Not on file  Social Connections: Not on file  Intimate Partner Violence: Not on file    Outpatient Medications Prior to Visit  Medication Sig Dispense Refill   fluticasone (FLONASE) 50 MCG/ACT nasal spray Place 2 sprays into both nostrils daily. 16 g 6   omeprazole (PRILOSEC) 20 MG capsule TAKE 1 CAPSULE(20 MG) BY MOUTH DAILY 30 capsule 5   simvastatin (ZOCOR) 40 MG tablet  Take 1 tablet (40 mg total) by mouth daily at 6 PM. 90 tablet 3   lisinopril-hydrochlorothiazide (ZESTORETIC) 20-25 MG tablet TAKE 1 TABLET BY MOUTH DAILY 30 tablet 5   No facility-administered medications prior to visit.    No Known Allergies  ROS Review of Systems  Constitutional:  Negative for chills and fever.  HENT:  Negative for congestion and sore throat.   Eyes:  Negative for pain and discharge.  Respiratory:  Negative for cough and shortness of breath.   Cardiovascular:  Negative for chest pain and palpitations.  Gastrointestinal:  Negative for constipation, diarrhea, nausea and vomiting.  Endocrine: Negative for polydipsia and polyuria.  Genitourinary:  Negative for dysuria and hematuria.  Musculoskeletal:  Negative for neck pain and neck stiffness.  Skin:  Negative for rash.  Neurological:  Negative for dizziness, weakness and numbness.  Psychiatric/Behavioral:  Negative for agitation and behavioral problems.       Objective:    Physical Exam Vitals reviewed.  Constitutional:      General: He is not in acute distress.    Appearance: He is obese. He is not diaphoretic.  HENT:     Head: Normocephalic and atraumatic.     Nose: Nose normal.     Mouth/Throat:     Mouth: Mucous membranes are moist.  Eyes:     General:  No scleral icterus.    Extraocular Movements: Extraocular movements intact.  Neck:     Vascular: No carotid bruit.  Cardiovascular:     Rate and Rhythm: Normal rate and regular rhythm.     Pulses: Normal pulses.     Heart sounds: Normal heart sounds. No murmur heard. Pulmonary:     Breath sounds: Normal breath sounds. No wheezing or rales.  Abdominal:     Palpations: Abdomen is soft.     Tenderness: There is no abdominal tenderness.  Musculoskeletal:     Cervical back: Neck supple. No tenderness.     Right lower leg: No edema.     Left lower leg: No edema.  Skin:    General: Skin is warm.     Findings: No rash.     Comments: Brownish mole  over left side of face, near nasal bridge -about 2 cm in diameter Oval-shaped subcutaneous cyst over left forearm, nontender  Neurological:     General: No focal deficit present.     Mental Status: He is alert and oriented to person, place, and time.     Cranial Nerves: No cranial nerve deficit.     Sensory: No sensory deficit.     Motor: No weakness.  Psychiatric:        Mood and Affect: Mood normal.        Behavior: Behavior normal.     BP 118/80   Pulse 70   Ht 5\' 4"  (1.626 m)   Wt 195 lb 3.2 oz (88.5 kg)   SpO2 92%   BMI 33.51 kg/m  Wt Readings from Last 3 Encounters:  09/26/23 195 lb 3.2 oz (88.5 kg)  01/03/23 191 lb 3.2 oz (86.7 kg)  07/10/22 193 lb 6.4 oz (87.7 kg)    Lab Results  Component Value Date   TSH 2.390 01/03/2023   Lab Results  Component Value Date   WBC 6.8 01/03/2023   HGB 16.4 01/03/2023   HCT 49.1 01/03/2023   MCV 94 01/03/2023   PLT 273 01/03/2023   Lab Results  Component Value Date   NA 139 01/03/2023   K 4.0 01/03/2023   CO2 23 01/03/2023   GLUCOSE 99 01/03/2023   BUN 22 01/03/2023   CREATININE 1.05 01/03/2023   BILITOT 0.5 01/03/2023   ALKPHOS 77 01/03/2023   AST 25 01/03/2023   ALT 31 01/03/2023   PROT 6.9 01/03/2023   ALBUMIN 4.4 01/03/2023   CALCIUM 9.7 01/03/2023   EGFR 81 01/03/2023   Lab Results  Component Value Date   CHOL 182 01/03/2023   Lab Results  Component Value Date   HDL 39 (L) 01/03/2023   Lab Results  Component Value Date   LDLCALC 111 (H) 01/03/2023   Lab Results  Component Value Date   TRIG 181 (H) 01/03/2023   Lab Results  Component Value Date   CHOLHDL 4.7 01/03/2023   Lab Results  Component Value Date   HGBA1C 5.8 (H) 01/03/2023      Assessment & Plan:   Essential hypertension BP Readings from Last 1 Encounters:  09/26/23 118/80   Well-controlled with Lisinopril-HCTZ 20-25 mg QD Counseled for compliance with the medications Advised DASH diet and moderate exercise/walking, at least  150 mins/week  Mixed hyperlipidemia On Simvastatin Check lipid profile  Prostate cancer screening Ordered PSA after discussing its limitations for prostate cancer screening, including false positive results leading to additional investigations.  Gastroesophageal reflux disease without esophagitis Well-controlled with Omeprazole    Meds  ordered this encounter  Medications   lisinopril-hydrochlorothiazide (ZESTORETIC) 20-25 MG tablet    Sig: Take 1 tablet by mouth daily.    Dispense:  90 tablet    Refill:  3    Follow-up: Return in about 4 months (around 01/26/2024) for Annual physical.    Anabel Halon, MD

## 2023-09-26 NOTE — Assessment & Plan Note (Signed)
 On Simvastatin Check lipid profile

## 2023-09-26 NOTE — Patient Instructions (Signed)
 Please continue to take medications as prescribed.  Please continue to follow DASH diet and perform moderate exercise/walking at least 150 mins/week.  Please get fasting blood tests done before the next visit.

## 2023-09-26 NOTE — Assessment & Plan Note (Signed)
 Ordered PSA after discussing its limitations for prostate cancer screening, including false positive results leading to additional investigations.

## 2023-10-11 NOTE — Progress Notes (Signed)
 Order(s) created erroneously. Erroneous order ID: [id number]  Order moved by: CHART CORRECTION ANALYST NINE, IDENTITY  Order move date/time: 10/11/2023 6:49 AM  Source Patient: [id number]  Source Contact: 09/26/2023  Destination Patient: [id number]  Destination Contact: 12/19/2022

## 2023-10-15 ENCOUNTER — Other Ambulatory Visit: Payer: Self-pay | Admitting: Internal Medicine

## 2023-10-15 DIAGNOSIS — E782 Mixed hyperlipidemia: Secondary | ICD-10-CM

## 2023-11-11 ENCOUNTER — Other Ambulatory Visit: Payer: Self-pay | Admitting: Internal Medicine

## 2023-11-11 DIAGNOSIS — I10 Essential (primary) hypertension: Secondary | ICD-10-CM

## 2023-12-10 ENCOUNTER — Other Ambulatory Visit: Payer: Self-pay | Admitting: Internal Medicine

## 2023-12-10 DIAGNOSIS — K219 Gastro-esophageal reflux disease without esophagitis: Secondary | ICD-10-CM

## 2023-12-14 ENCOUNTER — Other Ambulatory Visit: Payer: Self-pay | Admitting: Internal Medicine

## 2023-12-14 DIAGNOSIS — K219 Gastro-esophageal reflux disease without esophagitis: Secondary | ICD-10-CM

## 2024-01-28 ENCOUNTER — Encounter: Admitting: Internal Medicine

## 2024-03-06 ENCOUNTER — Ambulatory Visit (INDEPENDENT_AMBULATORY_CARE_PROVIDER_SITE_OTHER): Admitting: Internal Medicine

## 2024-03-06 ENCOUNTER — Encounter: Payer: Self-pay | Admitting: Internal Medicine

## 2024-03-06 VITALS — BP 139/78 | HR 72 | Ht 63.0 in | Wt 196.0 lb

## 2024-03-06 DIAGNOSIS — I10 Essential (primary) hypertension: Secondary | ICD-10-CM | POA: Diagnosis not present

## 2024-03-06 DIAGNOSIS — Z125 Encounter for screening for malignant neoplasm of prostate: Secondary | ICD-10-CM | POA: Diagnosis not present

## 2024-03-06 DIAGNOSIS — M51362 Other intervertebral disc degeneration, lumbar region with discogenic back pain and lower extremity pain: Secondary | ICD-10-CM | POA: Diagnosis not present

## 2024-03-06 DIAGNOSIS — Z0001 Encounter for general adult medical examination with abnormal findings: Secondary | ICD-10-CM | POA: Diagnosis not present

## 2024-03-06 DIAGNOSIS — R7303 Prediabetes: Secondary | ICD-10-CM | POA: Insufficient documentation

## 2024-03-06 DIAGNOSIS — E559 Vitamin D deficiency, unspecified: Secondary | ICD-10-CM | POA: Diagnosis not present

## 2024-03-06 DIAGNOSIS — E782 Mixed hyperlipidemia: Secondary | ICD-10-CM | POA: Diagnosis not present

## 2024-03-06 DIAGNOSIS — K219 Gastro-esophageal reflux disease without esophagitis: Secondary | ICD-10-CM

## 2024-03-06 MED ORDER — LISINOPRIL-HYDROCHLOROTHIAZIDE 20-25 MG PO TABS: 1.0000 | ORAL_TABLET | Freq: Every day | ORAL | 3 refills | Status: AC

## 2024-03-06 MED ORDER — OMEPRAZOLE 20 MG PO CPDR: 20.0000 mg | DELAYED_RELEASE_CAPSULE | Freq: Every day | ORAL | 3 refills | Status: AC

## 2024-03-06 NOTE — Assessment & Plan Note (Addendum)
 Physical exam as documented. Fasting blood tests today. Had Colonoscopy in [place] in 2018. Denies pneumococcal vaccine today - fatigued due to last night work shift.

## 2024-03-06 NOTE — Assessment & Plan Note (Signed)
 BP Readings from Last 1 Encounters:  03/06/24 139/78   Well-controlled with Lisinopril -HCTZ 20-25 mg QD Counseled for compliance with the medications Advised DASH diet and moderate exercise/walking, at least 150 mins/week

## 2024-03-06 NOTE — Assessment & Plan Note (Addendum)
 Lab Results  Component Value Date   HGBA1C 5.8 (H) 01/03/2023   Advised to follow DASH diet

## 2024-03-06 NOTE — Assessment & Plan Note (Addendum)
 On Simvastatin  40 mg QD Check lipid profile

## 2024-03-06 NOTE — Assessment & Plan Note (Signed)
Well-controlled with Omeprazole 

## 2024-03-06 NOTE — Assessment & Plan Note (Addendum)
 Likely has DDD of lumbar spine Advised to avoid heavy lifting and frequent bending Tylenol arthritis as needed for pain, can alternate with ibuprofen  if persistent pain Simple back exercises material provided Heating pad and/or back brace as needed If persistent, will consider imaging

## 2024-03-06 NOTE — Patient Instructions (Addendum)
 Please continue to take medications as prescribed.  Please continue to follow DASH diet and perform moderate exercise/walking at least 150 mins/week.

## 2024-03-06 NOTE — Progress Notes (Signed)
 Established Patient Office Visit  Subjective:  Patient ID: Alexander Henson, male    DOB: [birth year]  Age: 63 y.o. MRN: [id number]  CC:  Chief Complaint  Patient presents with   Annual Exam    Cpe , 4 month f/u.    Back Pain    Reports sx of back pain ongoing for 1 month, went to chiropractor, is also having right leg pain.     HPI Alexander Henson is a 63 y.o. male with past medical history of HTN and HLD who presents for f/u of her chronic medical conditions.  HTN: BP is well-controlled overall. Takes medications regularly. Patient denies headache, dizziness, chest pain, dyspnea or palpitations.  He c/o leg swelling after standing for 10 hours on a concrete floor at work. Denies any orthopnea or PND. His swelling improves after leg elevation at nighttime.  He complains of intermittent back pain for the last 1 month, which is dull, radiating to bilateral LE and worse with prolonged standing.  He has tried simple back exercises with mild relief.  He has also tried taking ibuprofen  with adequate relief.  Denies any recent injury or fall.  Denies saddle anesthesia, urinary or stool incontinence.   Past Medical History:  Diagnosis Date   Hyperlipidemia    Hypertension     History reviewed. No pertinent surgical history.  History reviewed. No pertinent family history.  Social History   Socioeconomic History   Marital status: Single    Spouse name: Not on file   Number of children: Not on file   Years of education: Not on file   Highest education level: Not on file  Occupational History   Not on file  Tobacco Use   Smoking status: Some Days    Types: Cigarettes   Smokeless tobacco: Never  Substance and Sexual Activity   Alcohol use: Not Currently   Drug use: Never   Sexual activity: Not on file  Other Topics Concern   Not on file  Social History Narrative   Not on file   Social Drivers of Health   Financial Resource Strain: Not on file  Food Insecurity: Not on file   Transportation Needs: Not on file  Physical Activity: Not on file  Stress: Not on file  Social Connections: Not on file  Intimate Partner Violence: Not on file    Outpatient Medications Prior to Visit  Medication Sig Dispense Refill   simvastatin  (ZOCOR ) 40 MG tablet TAKE 1 TABLET(40 MG) BY MOUTH DAILY AT 6 PM 90 tablet 3   lisinopril -hydrochlorothiazide  (ZESTORETIC ) 20-25 MG tablet TAKE 1 TABLET BY MOUTH DAILY 30 tablet 0   omeprazole  (PRILOSEC) 20 MG capsule TAKE 1 CAPSULE(20 MG) BY MOUTH DAILY 90 capsule 0   fluticasone  (FLONASE ) 50 MCG/ACT nasal spray Place 2 sprays into both nostrils daily. (Patient not taking: Reported on 03/06/2024) 16 g 6   No facility-administered medications prior to visit.    No Known Allergies  ROS Review of Systems  Constitutional:  Negative for chills and fever.  HENT:  Negative for congestion and sore throat.   Eyes:  Negative for pain and discharge.  Respiratory:  Negative for cough and shortness of breath.   Cardiovascular:  Negative for chest pain and palpitations.  Gastrointestinal:  Negative for constipation, diarrhea, nausea and vomiting.  Endocrine: Negative for polydipsia and polyuria.  Genitourinary:  Negative for dysuria and hematuria.  Musculoskeletal:  Positive for back pain. Negative for neck pain and neck stiffness.  Skin:  Negative for  rash.  Neurological:  Negative for dizziness, weakness and numbness.  Psychiatric/Behavioral:  Negative for agitation and behavioral problems.       Objective:    Physical Exam Vitals reviewed.  Constitutional:      General: He is not in acute distress.    Appearance: He is obese. He is not diaphoretic.  HENT:     Head: Normocephalic and atraumatic.     Nose: Nose normal.     Mouth/Throat:     Mouth: Mucous membranes are moist.  Eyes:     General: No scleral icterus.    Extraocular Movements: Extraocular movements intact.  Neck:     Vascular: No carotid bruit.  Cardiovascular:      Rate and Rhythm: Normal rate and regular rhythm.     Pulses: Normal pulses.     Heart sounds: Normal heart sounds. No murmur heard. Pulmonary:     Breath sounds: Normal breath sounds. No wheezing or rales.  Abdominal:     Palpations: Abdomen is soft.     Tenderness: There is no abdominal tenderness.  Musculoskeletal:     Cervical back: Neck supple. No tenderness.     Lumbar back: No tenderness. Normal range of motion.     Right lower leg: No edema.     Left lower leg: No edema.  Skin:    General: Skin is warm.     Findings: No rash.     Comments: Brownish mole over left side of face, near nasal bridge -about 2 cm in diameter Oval-shaped subcutaneous cyst over left forearm, nontender  Neurological:     General: No focal deficit present.     Mental Status: He is alert and oriented to person, place, and time.     Cranial Nerves: No cranial nerve deficit.     Sensory: No sensory deficit.     Motor: No weakness.  Psychiatric:        Mood and Affect: Mood normal.        Behavior: Behavior normal.     BP 139/78   Pulse 72   Ht 5' 3 (1.6 m)   Wt 196 lb (88.9 kg)   SpO2 100%   BMI 34.72 kg/m  Wt Readings from Last 3 Encounters:  03/06/24 196 lb (88.9 kg)  09/26/23 195 lb 3.2 oz (88.5 kg)  01/03/23 191 lb 3.2 oz (86.7 kg)    Lab Results  Component Value Date   TSH 2.390 01/03/2023   Lab Results  Component Value Date   WBC 6.8 01/03/2023   HGB 16.4 01/03/2023   HCT 49.1 01/03/2023   MCV 94 01/03/2023   PLT 273 01/03/2023   Lab Results  Component Value Date   NA 139 01/03/2023   K 4.0 01/03/2023   CO2 23 01/03/2023   GLUCOSE 99 01/03/2023   BUN 22 01/03/2023   CREATININE 1.05 01/03/2023   BILITOT 0.5 01/03/2023   ALKPHOS 77 01/03/2023   AST 25 01/03/2023   ALT 31 01/03/2023   PROT 6.9 01/03/2023   ALBUMIN 4.4 01/03/2023   CALCIUM 9.7 01/03/2023   EGFR 81 01/03/2023   Lab Results  Component Value Date   CHOL 182 01/03/2023   Lab Results  Component  Value Date   HDL 39 (L) 01/03/2023   Lab Results  Component Value Date   LDLCALC 111 (H) 01/03/2023   Lab Results  Component Value Date   TRIG 181 (H) 01/03/2023   Lab Results  Component Value Date   CHOLHDL  4.7 01/03/2023   Lab Results  Component Value Date   HGBA1C 5.8 (H) 01/03/2023      Assessment & Plan:   Problem List Items Addressed This Visit       Cardiovascular and Mediastinum   Essential hypertension   BP Readings from Last 1 Encounters:  03/06/24 139/78   Well-controlled with Lisinopril -HCTZ 20-25 mg QD Counseled for compliance with the medications Advised DASH diet and moderate exercise/walking, at least 150 mins/week      Relevant Medications   lisinopril -hydrochlorothiazide  (ZESTORETIC ) 20-25 MG tablet     Digestive   Gastroesophageal reflux disease without esophagitis   Well-controlled with Omeprazole       Relevant Medications   omeprazole  (PRILOSEC) 20 MG capsule     Musculoskeletal and Integument   Degeneration of intervertebral disc of lumbar region with discogenic back pain and lower extremity pain   Likely has DDD of lumbar spine Advised to avoid heavy lifting and frequent bending Tylenol arthritis as needed for pain, can alternate with ibuprofen  if persistent pain Simple back exercises material provided Heating pad and/or back brace as needed If persistent, will consider imaging        Other   Mixed hyperlipidemia   On Simvastatin  40 mg QD Check lipid profile      Relevant Medications   lisinopril -hydrochlorothiazide  (ZESTORETIC ) 20-25 MG tablet   Encounter for general adult medical examination with abnormal findings - Primary   Physical exam as documented. Fasting blood tests today. Had Colonoscopy in [place] in 2018. Denies pneumococcal vaccine today - fatigued due to last night work shift.      Prostate cancer screening   Ordered PSA after discussing its limitations for prostate cancer screening, including false positive  results leading to additional investigations.      Prediabetes   Lab Results  Component Value Date   HGBA1C 5.8 (H) 01/03/2023   Advised to follow DASH diet           Meds ordered this encounter  Medications   lisinopril -hydrochlorothiazide  (ZESTORETIC ) 20-25 MG tablet    Sig: Take 1 tablet by mouth daily.    Dispense:  90 tablet    Refill:  3   omeprazole  (PRILOSEC) 20 MG capsule    Sig: Take 1 capsule (20 mg total) by mouth daily.    Dispense:  90 capsule    Refill:  3    Follow-up: Return in about 6 months (around 09/05/2024) for HTN.    Suzzane MARLA Blanch, MD

## 2024-03-06 NOTE — Assessment & Plan Note (Signed)
 Ordered PSA after discussing its limitations for prostate cancer screening, including false positive results leading to additional investigations.

## 2024-03-07 LAB — CBC WITH DIFFERENTIAL/PLATELET
Basophils Absolute: 0.1 x10E3/uL (ref 0.0–0.2)
Basos: 1 %
EOS (ABSOLUTE): 0.5 x10E3/uL — ABNORMAL HIGH (ref 0.0–0.4)
Eos: 6 %
Hematocrit: 46.1 % (ref 37.5–51.0)
Hemoglobin: 15.8 g/dL (ref 13.0–17.7)
Immature Grans (Abs): 0 x10E3/uL (ref 0.0–0.1)
Immature Granulocytes: 0 %
Lymphocytes Absolute: 2.5 x10E3/uL (ref 0.7–3.1)
Lymphs: 29 %
MCH: 33.4 pg — ABNORMAL HIGH (ref 26.6–33.0)
MCHC: 34.3 g/dL (ref 31.5–35.7)
MCV: 98 fL — ABNORMAL HIGH (ref 79–97)
Monocytes Absolute: 0.9 x10E3/uL (ref 0.1–0.9)
Monocytes: 10 %
Neutrophils Absolute: 4.6 x10E3/uL (ref 1.4–7.0)
Neutrophils: 54 %
Platelets: 258 x10E3/uL (ref 150–450)
RBC: 4.73 x10E6/uL (ref 4.14–5.80)
RDW: 11.9 % (ref 11.6–15.4)
WBC: 8.5 x10E3/uL (ref 3.4–10.8)

## 2024-03-07 LAB — CMP14+EGFR
ALT: 35 IU/L (ref 0–44)
AST: 29 IU/L (ref 0–40)
Albumin: 4.6 g/dL (ref 3.9–4.9)
Alkaline Phosphatase: 71 IU/L (ref 44–121)
BUN/Creatinine Ratio: 20 (ref 10–24)
BUN: 20 mg/dL (ref 8–27)
Bilirubin Total: 0.6 mg/dL (ref 0.0–1.2)
CO2: 24 mmol/L (ref 20–29)
Calcium: 10.1 mg/dL (ref 8.6–10.2)
Chloride: 101 mmol/L (ref 96–106)
Creatinine, Ser: 1.01 mg/dL (ref 0.76–1.27)
Globulin, Total: 2.4 g/dL (ref 1.5–4.5)
Glucose: 87 mg/dL (ref 70–99)
Potassium: 4 mmol/L (ref 3.5–5.2)
Sodium: 139 mmol/L (ref 134–144)
Total Protein: 7 g/dL (ref 6.0–8.5)
eGFR: 84 mL/min/1.73 (ref >59)

## 2024-03-07 LAB — LIPID PANEL
Chol/HDL Ratio: 4.3 ratio (ref 0.0–5.0)
Cholesterol, Total: 158 mg/dL (ref 100–199)
HDL: 37 mg/dL — ABNORMAL LOW (ref >39)
LDL Chol Calc (NIH): 94 mg/dL (ref 0–99)
Triglycerides: 153 mg/dL — ABNORMAL HIGH (ref 0–149)
VLDL Cholesterol Cal: 27 mg/dL (ref 5–40)

## 2024-03-07 LAB — TSH: TSH: 2.53 u[IU]/mL (ref 0.450–4.500)

## 2024-03-07 LAB — PSA: Prostate Specific Ag, Serum: 1.4 ng/mL (ref 0.0–4.0)

## 2024-03-07 LAB — VITAMIN D 25 HYDROXY (VIT D DEFICIENCY, FRACTURES): Vit D, 25-Hydroxy: 36.7 ng/mL (ref 30.0–100.0)

## 2024-03-07 LAB — HEMOGLOBIN A1C
Est. average glucose Bld gHb Est-mCnc: 114 mg/dL
Hgb A1c MFr Bld: 5.6 % (ref 4.8–5.6)

## 2024-03-09 ENCOUNTER — Ambulatory Visit: Payer: Self-pay | Admitting: Internal Medicine

## 2024-05-18 DIAGNOSIS — H903 Sensorineural hearing loss, bilateral: Secondary | ICD-10-CM | POA: Diagnosis not present
